# Patient Record
Sex: Female | Born: 1955 | Race: White | Hispanic: No | Marital: Married | Smoking: Never smoker
Health system: Southern US, Community
[De-identification: ages and names within clinical notes are randomized; demographics above are authoritative.]

## PROBLEM LIST (undated history)

## (undated) DIAGNOSIS — T7840XA Allergy, unspecified, initial encounter: Secondary | ICD-10-CM

## (undated) DIAGNOSIS — R7989 Other specified abnormal findings of blood chemistry: Secondary | ICD-10-CM

## (undated) DIAGNOSIS — M8580 Other specified disorders of bone density and structure, unspecified site: Secondary | ICD-10-CM

## (undated) DIAGNOSIS — C801 Malignant (primary) neoplasm, unspecified: Secondary | ICD-10-CM

## (undated) DIAGNOSIS — H269 Unspecified cataract: Secondary | ICD-10-CM

## (undated) DIAGNOSIS — E0590 Thyrotoxicosis, unspecified without thyrotoxic crisis or storm: Secondary | ICD-10-CM

## (undated) DIAGNOSIS — F429 Obsessive-compulsive disorder, unspecified: Secondary | ICD-10-CM

## (undated) DIAGNOSIS — Z9889 Other specified postprocedural states: Secondary | ICD-10-CM

## (undated) DIAGNOSIS — R112 Nausea with vomiting, unspecified: Secondary | ICD-10-CM

## (undated) DIAGNOSIS — Q969 Turner's syndrome, unspecified: Secondary | ICD-10-CM

## (undated) DIAGNOSIS — R945 Abnormal results of liver function studies: Secondary | ICD-10-CM

## (undated) HISTORY — DX: Malignant (primary) neoplasm, unspecified: C80.1

## (undated) HISTORY — DX: Thyrotoxicosis, unspecified without thyrotoxic crisis or storm: E05.90

## (undated) HISTORY — DX: Other specified postprocedural states: Z98.890

## (undated) HISTORY — DX: Other specified abnormal findings of blood chemistry: R79.89

## (undated) HISTORY — DX: Abnormal results of liver function studies: R94.5

## (undated) HISTORY — PX: AUGMENTATION MAMMAPLASTY: SUR837

## (undated) HISTORY — DX: Other specified disorders of bone density and structure, unspecified site: M85.80

## (undated) HISTORY — PX: POLYPECTOMY: SHX149

## (undated) HISTORY — DX: Allergy, unspecified, initial encounter: T78.40XA

## (undated) HISTORY — PX: DILATION AND CURETTAGE OF UTERUS: SHX78

## (undated) HISTORY — PX: COLONOSCOPY: SHX174

## (undated) HISTORY — DX: Turner's syndrome, unspecified: Q96.9

## (undated) HISTORY — DX: Obsessive-compulsive disorder, unspecified: F42.9

## (undated) HISTORY — DX: Unspecified cataract: H26.9

## (undated) HISTORY — DX: Nausea with vomiting, unspecified: R11.2

## (undated) HISTORY — PX: EYE SURGERY: SHX253

---

## 1986-05-12 HISTORY — PX: BREAST ENHANCEMENT SURGERY: SHX7

## 1999-03-01 ENCOUNTER — Encounter: Admission: RE | Admit: 1999-03-01 | Discharge: 1999-03-01 | Payer: Self-pay | Admitting: *Deleted

## 1999-03-01 ENCOUNTER — Encounter: Payer: Self-pay | Admitting: *Deleted

## 1999-10-28 ENCOUNTER — Encounter: Payer: Self-pay | Admitting: *Deleted

## 1999-10-28 ENCOUNTER — Encounter: Admission: RE | Admit: 1999-10-28 | Discharge: 1999-10-28 | Payer: Self-pay | Admitting: *Deleted

## 2001-03-04 ENCOUNTER — Encounter: Admission: RE | Admit: 2001-03-04 | Discharge: 2001-03-04 | Payer: Self-pay | Admitting: Internal Medicine

## 2001-03-04 ENCOUNTER — Encounter: Payer: Self-pay | Admitting: Internal Medicine

## 2002-05-24 ENCOUNTER — Encounter: Admission: RE | Admit: 2002-05-24 | Discharge: 2002-05-24 | Payer: Self-pay | Admitting: Internal Medicine

## 2002-05-24 ENCOUNTER — Encounter: Payer: Self-pay | Admitting: Internal Medicine

## 2003-07-14 ENCOUNTER — Encounter: Admission: RE | Admit: 2003-07-14 | Discharge: 2003-07-14 | Payer: Self-pay | Admitting: Family Medicine

## 2003-08-24 ENCOUNTER — Encounter: Admission: RE | Admit: 2003-08-24 | Discharge: 2003-08-24 | Payer: Self-pay | Admitting: Family Medicine

## 2004-08-30 ENCOUNTER — Ambulatory Visit: Payer: Self-pay | Admitting: Family Medicine

## 2004-08-30 ENCOUNTER — Encounter: Admission: RE | Admit: 2004-08-30 | Discharge: 2004-08-30 | Payer: Self-pay | Admitting: Family Medicine

## 2004-09-03 ENCOUNTER — Ambulatory Visit: Payer: Self-pay | Admitting: Family Medicine

## 2004-10-10 ENCOUNTER — Encounter: Payer: Self-pay | Admitting: Family Medicine

## 2004-12-12 ENCOUNTER — Ambulatory Visit: Payer: Self-pay | Admitting: Family Medicine

## 2005-04-10 ENCOUNTER — Ambulatory Visit: Payer: Self-pay | Admitting: Family Medicine

## 2005-04-25 ENCOUNTER — Ambulatory Visit: Payer: Self-pay | Admitting: Family Medicine

## 2005-04-28 ENCOUNTER — Ambulatory Visit: Payer: Self-pay | Admitting: Family Medicine

## 2005-05-07 ENCOUNTER — Ambulatory Visit: Payer: Self-pay | Admitting: Family Medicine

## 2005-07-03 ENCOUNTER — Ambulatory Visit: Payer: Self-pay | Admitting: Family Medicine

## 2005-10-14 ENCOUNTER — Encounter: Admission: RE | Admit: 2005-10-14 | Discharge: 2005-10-14 | Payer: Self-pay | Admitting: Family Medicine

## 2005-11-26 ENCOUNTER — Ambulatory Visit: Payer: Self-pay | Admitting: Family Medicine

## 2005-12-02 ENCOUNTER — Ambulatory Visit: Payer: Self-pay | Admitting: Gastroenterology

## 2005-12-23 ENCOUNTER — Ambulatory Visit: Payer: Self-pay | Admitting: Internal Medicine

## 2006-04-20 ENCOUNTER — Ambulatory Visit: Payer: Self-pay | Admitting: Internal Medicine

## 2006-04-20 LAB — CONVERTED CEMR LAB
ALT: 38 units/L (ref 0–40)
Albumin: 3.7 g/dL (ref 3.5–5.2)
Bilirubin, Direct: 0.1 mg/dL (ref 0.0–0.3)
Total Bilirubin: 0.8 mg/dL (ref 0.3–1.2)
Total Protein: 6.3 g/dL (ref 6.0–8.3)

## 2006-08-06 ENCOUNTER — Ambulatory Visit: Payer: Self-pay | Admitting: Internal Medicine

## 2006-08-06 LAB — CONVERTED CEMR LAB
ALT: 33 units/L (ref 0–40)
AST: 23 units/L (ref 0–37)
Albumin: 3.7 g/dL (ref 3.5–5.2)
Alkaline Phosphatase: 130 units/L — ABNORMAL HIGH (ref 39–117)
Total Protein: 6.3 g/dL (ref 6.0–8.3)

## 2006-10-16 ENCOUNTER — Encounter: Admission: RE | Admit: 2006-10-16 | Discharge: 2006-10-16 | Payer: Self-pay | Admitting: Family Medicine

## 2006-10-23 ENCOUNTER — Encounter (INDEPENDENT_AMBULATORY_CARE_PROVIDER_SITE_OTHER): Payer: Self-pay | Admitting: *Deleted

## 2006-11-20 ENCOUNTER — Encounter: Payer: Self-pay | Admitting: Family Medicine

## 2006-11-20 DIAGNOSIS — F429 Obsessive-compulsive disorder, unspecified: Secondary | ICD-10-CM

## 2006-11-20 DIAGNOSIS — Q969 Turner's syndrome, unspecified: Secondary | ICD-10-CM | POA: Insufficient documentation

## 2006-11-20 DIAGNOSIS — K648 Other hemorrhoids: Secondary | ICD-10-CM | POA: Insufficient documentation

## 2006-11-20 DIAGNOSIS — H9190 Unspecified hearing loss, unspecified ear: Secondary | ICD-10-CM

## 2006-11-30 ENCOUNTER — Ambulatory Visit: Payer: Self-pay | Admitting: Family Medicine

## 2006-11-30 DIAGNOSIS — E039 Hypothyroidism, unspecified: Secondary | ICD-10-CM | POA: Insufficient documentation

## 2006-12-01 ENCOUNTER — Encounter: Payer: Self-pay | Admitting: Family Medicine

## 2006-12-01 LAB — CONVERTED CEMR LAB
ALT: 49 units/L — ABNORMAL HIGH (ref 0–35)
AST: 34 units/L (ref 0–37)
Albumin: 3.9 g/dL (ref 3.5–5.2)
Alkaline Phosphatase: 142 units/L — ABNORMAL HIGH (ref 39–117)
BUN: 10 mg/dL (ref 6–23)
Basophils Absolute: 0 10*3/uL (ref 0.0–0.1)
Bilirubin, Direct: 0.1 mg/dL (ref 0.0–0.3)
Calcium: 9 mg/dL (ref 8.4–10.5)
Chloride: 104 meq/L (ref 96–112)
Eosinophils Absolute: 0.1 10*3/uL (ref 0.0–0.6)
GFR calc Af Amer: 115 mL/min
GFR calc non Af Amer: 95 mL/min
Glucose, Bld: 101 mg/dL — ABNORMAL HIGH (ref 70–99)
HCT: 37.7 % (ref 36.0–46.0)
HDL: 62.7 mg/dL (ref >39.0)
LDL Cholesterol: 98 mg/dL (ref 0–99)
Neutrophils Relative %: 63.9 % (ref 43.0–77.0)
Platelets: 277 10*3/uL (ref 150–400)
Potassium: 4.1 meq/L (ref 3.5–5.1)
RBC: 4 M/uL (ref 3.87–5.11)
Sodium: 142 meq/L (ref 135–145)

## 2006-12-29 ENCOUNTER — Encounter: Payer: Self-pay | Admitting: Family Medicine

## 2006-12-29 DIAGNOSIS — M8580 Other specified disorders of bone density and structure, unspecified site: Secondary | ICD-10-CM

## 2007-01-27 ENCOUNTER — Ambulatory Visit: Payer: Self-pay | Admitting: Family Medicine

## 2007-01-27 DIAGNOSIS — R740 Nonspecific elevation of levels of transaminase and lactic acid dehydrogenase [LDH]: Secondary | ICD-10-CM

## 2007-01-29 LAB — CONVERTED CEMR LAB
ALT: 34 units/L (ref 0–35)
Albumin: 3.9 g/dL (ref 3.5–5.2)
Bilirubin, Direct: 0.1 mg/dL (ref 0.0–0.3)
IgA: 259 mg/dL (ref 68–378)
Total Protein: 6.4 g/dL (ref 6.0–8.3)

## 2007-10-21 ENCOUNTER — Encounter: Admission: RE | Admit: 2007-10-21 | Discharge: 2007-10-21 | Payer: Self-pay | Admitting: Family Medicine

## 2007-10-26 ENCOUNTER — Encounter (INDEPENDENT_AMBULATORY_CARE_PROVIDER_SITE_OTHER): Payer: Self-pay | Admitting: *Deleted

## 2007-12-07 ENCOUNTER — Telehealth (INDEPENDENT_AMBULATORY_CARE_PROVIDER_SITE_OTHER): Payer: Self-pay | Admitting: *Deleted

## 2007-12-30 ENCOUNTER — Ambulatory Visit: Payer: Self-pay | Admitting: Family Medicine

## 2007-12-30 DIAGNOSIS — B353 Tinea pedis: Secondary | ICD-10-CM

## 2008-01-05 LAB — CONVERTED CEMR LAB
AST: 25 units/L (ref 0–37)
Albumin: 4 g/dL (ref 3.5–5.2)
Alkaline Phosphatase: 115 units/L (ref 39–117)
Basophils Absolute: 0 10*3/uL (ref 0.0–0.1)
Basophils Relative: 0.2 % (ref 0.0–3.0)
Calcium: 9.2 mg/dL (ref 8.4–10.5)
Eosinophils Absolute: 0.1 10*3/uL (ref 0.0–0.7)
Eosinophils Relative: 0.8 % (ref 0.0–5.0)
HCT: 38.6 % (ref 36.0–46.0)
MCHC: 34.7 g/dL (ref 30.0–36.0)
Monocytes Absolute: 0.8 10*3/uL (ref 0.1–1.0)
Monocytes Relative: 8.2 % (ref 3.0–12.0)
Neutro Abs: 6.3 10*3/uL (ref 1.4–7.7)
Neutrophils Relative %: 66.9 % (ref 43.0–77.0)
Potassium: 4 meq/L (ref 3.5–5.1)
TSH: 4.09 microintl units/mL (ref 0.35–5.50)
Total Protein: 6.2 g/dL (ref 6.0–8.3)

## 2008-01-06 ENCOUNTER — Ambulatory Visit: Payer: Self-pay | Admitting: Family Medicine

## 2008-01-06 LAB — CONVERTED CEMR LAB: OCCULT 1: NEGATIVE

## 2008-03-07 ENCOUNTER — Telehealth: Payer: Self-pay | Admitting: Family Medicine

## 2008-10-26 ENCOUNTER — Encounter: Admission: RE | Admit: 2008-10-26 | Discharge: 2008-10-26 | Payer: Self-pay | Admitting: Family Medicine

## 2008-10-27 ENCOUNTER — Encounter (INDEPENDENT_AMBULATORY_CARE_PROVIDER_SITE_OTHER): Payer: Self-pay | Admitting: *Deleted

## 2009-01-02 ENCOUNTER — Ambulatory Visit: Payer: Self-pay | Admitting: Family Medicine

## 2009-01-04 ENCOUNTER — Encounter (INDEPENDENT_AMBULATORY_CARE_PROVIDER_SITE_OTHER): Payer: Self-pay | Admitting: *Deleted

## 2009-01-04 LAB — CONVERTED CEMR LAB
ALT: 42 units/L — ABNORMAL HIGH (ref 0–35)
AST: 26 units/L (ref 0–37)
Albumin: 4.2 g/dL (ref 3.5–5.2)
Alkaline Phosphatase: 124 units/L — ABNORMAL HIGH (ref 39–117)
Bilirubin, Direct: 0 mg/dL (ref 0.0–0.3)
Cholesterol: 164 mg/dL (ref 0–200)
Eosinophils Absolute: 0.1 10*3/uL (ref 0.0–0.7)
Eosinophils Relative: 1 % (ref 0.0–5.0)
HCT: 40.2 % (ref 36.0–46.0)
HDL: 58.4 mg/dL (ref >39.00)
Lymphocytes Relative: 21 % (ref 12.0–46.0)
MCV: 95.9 fL (ref 78.0–100.0)
Monocytes Absolute: 0.7 10*3/uL (ref 0.1–1.0)
Neutro Abs: 5.7 10*3/uL (ref 1.4–7.7)
Neutrophils Relative %: 69 % (ref 43.0–77.0)
Potassium: 3.9 meq/L (ref 3.5–5.1)
RDW: 11.8 % (ref 11.5–14.6)
TSH: 3.76 microintl units/mL (ref 0.35–5.50)
Total CHOL/HDL Ratio: 3
Triglycerides: 94 mg/dL (ref 0.0–149.0)
VLDL: 18.8 mg/dL (ref 0.0–40.0)

## 2009-01-23 ENCOUNTER — Telehealth: Payer: Self-pay | Admitting: Family Medicine

## 2009-01-29 ENCOUNTER — Encounter: Payer: Self-pay | Admitting: Family Medicine

## 2009-02-13 ENCOUNTER — Ambulatory Visit: Payer: Self-pay | Admitting: Family Medicine

## 2009-05-14 ENCOUNTER — Ambulatory Visit: Payer: Self-pay | Admitting: Family Medicine

## 2009-05-14 DIAGNOSIS — H6592 Unspecified nonsuppurative otitis media, left ear: Secondary | ICD-10-CM

## 2009-05-22 ENCOUNTER — Encounter: Payer: Self-pay | Admitting: Family Medicine

## 2009-05-25 ENCOUNTER — Ambulatory Visit: Payer: Self-pay | Admitting: Family Medicine

## 2009-05-28 ENCOUNTER — Telehealth (INDEPENDENT_AMBULATORY_CARE_PROVIDER_SITE_OTHER): Payer: Self-pay | Admitting: *Deleted

## 2009-12-28 ENCOUNTER — Encounter: Admission: RE | Admit: 2009-12-28 | Discharge: 2009-12-28 | Payer: Self-pay | Admitting: Family Medicine

## 2010-01-01 ENCOUNTER — Encounter (INDEPENDENT_AMBULATORY_CARE_PROVIDER_SITE_OTHER): Payer: Self-pay | Admitting: *Deleted

## 2010-01-04 ENCOUNTER — Encounter: Payer: Self-pay | Admitting: Family Medicine

## 2010-02-22 ENCOUNTER — Telehealth (INDEPENDENT_AMBULATORY_CARE_PROVIDER_SITE_OTHER): Payer: Self-pay | Admitting: *Deleted

## 2010-02-25 ENCOUNTER — Ambulatory Visit: Payer: Self-pay | Admitting: Family Medicine

## 2010-02-25 LAB — CONVERTED CEMR LAB
Basophils Absolute: 0 10*3/uL (ref 0.0–0.1)
Basophils Relative: 0.5 % (ref 0.0–3.0)
Bilirubin, Direct: 0.2 mg/dL (ref 0.0–0.3)
Calcium: 8.9 mg/dL (ref 8.4–10.5)
Eosinophils Relative: 1.4 % (ref 0.0–5.0)
GFR calc non Af Amer: 91.8 mL/min (ref >60)
Glucose, Bld: 90 mg/dL (ref 70–99)
HCT: 37.3 % (ref 36.0–46.0)
Lymphocytes Relative: 26.9 % (ref 12.0–46.0)
MCHC: 34.2 g/dL (ref 30.0–36.0)
Neutrophils Relative %: 60.8 % (ref 43.0–77.0)
RDW: 12.7 % (ref 11.5–14.6)
Sodium: 139 meq/L (ref 135–145)
Total Bilirubin: 1.1 mg/dL (ref 0.3–1.2)
WBC: 7.2 10*3/uL (ref 4.5–10.5)

## 2010-02-27 ENCOUNTER — Ambulatory Visit: Payer: Self-pay | Admitting: Family Medicine

## 2010-03-19 ENCOUNTER — Ambulatory Visit: Payer: Self-pay | Admitting: Family Medicine

## 2010-03-21 ENCOUNTER — Encounter (INDEPENDENT_AMBULATORY_CARE_PROVIDER_SITE_OTHER): Payer: Self-pay | Admitting: *Deleted

## 2010-05-12 HISTORY — PX: OTHER SURGICAL HISTORY: SHX169

## 2010-06-11 NOTE — Assessment & Plan Note (Signed)
Summary: CPX/CLE   Vital Signs:  Patient profile:   55 year old female Height:      59.75 inches Weight:      151.50 pounds BMI:     29.94 Temp:     98.3 degrees F oral Pulse rate:   80 / minute Pulse rhythm:   regular BP sitting:   118 / 76  (left arm) Cuff size:   regular  Vitals Entered By: Lewanda Rife LPN (February 27, 2010 2:29 PM) CC: CPX LMP Years ago   History of Present Illness: here for health mt exam and to rev chronic med problems  is keeping busy and feeling good overall   foot fungus is much better - just a little left   wt is up 4 lb has been eating reasonably healthy  something always comes up to cancel exercise    bp is 118/76- good   tsh stable  other labs are good   osteopenia - on premphase -- gyn still has her on it for symptoms, ? when she decides to stop it  fosamax - close to 5 years  ca and vit d - still taking that  dexa 910  pap 8/09 last visit did not do exam -- was this august  did not do breast exam either  mam 8/11 -- was normal  self exam --no lumps   td was 04   at [facility] - now gets regular mri (cardiac) instead of echo for her turners-  know why   Td 04   will get a flu shot today  declines colonoscopy- will do stool card for screening   Allergies (verified): No Known Drug Allergies  Past History:  Past Medical History: Last updated: 01/02/2009 Hyperthyroidism hearing loss - with hearing aides  OCD turner's syndrome  osteopenia elevated LFTs   ENT- Dr Lovey Newcomer  gyn- at [facility]   Past Surgical History: Last updated: 11/20/2006 Breast implants Dexa- osteopenia Abd [place]- neg (11/2005) Dexa- increased BMD (2006) Cholecystectomy (03/2006)  Family History: Last updated: 11/30/2006 Father: DJD, DM, prostate ca, colon polyps removed PGM DM Mother:DM GM colon ca  Siblings: 1 brother, 1 sister  Social History: Last updated: 12/30/2007 Marital Status: Married Children: none Occupation: home non smoker    Risk Factors: Smoking Status: never (11/20/2006)  Review of Systems General:  Denies fatigue, loss of appetite, and malaise. Eyes:  Denies blurring and eye irritation. CV:  Denies chest pain or discomfort and lightheadness. Resp:  Denies cough, shortness of breath, and wheezing. GI:  Denies abdominal pain and change in bowel habits. MS:  Denies joint pain, joint redness, joint swelling, and stiffness. Derm:  Denies itching, lesion(s), poor wound healing, and rash. Neuro:  Denies headaches, numbness, and tingling. Psych:  Denies anxiety and depression. Endo:  Denies cold intolerance, excessive thirst, excessive urination, and heat intolerance. Heme:  Denies abnormal bruising and bleeding.  Physical Exam  General:  Well-developed,well-nourished,in no acute distress; alert,appropriate and cooperative throughout examination Head:  normocephalic, atraumatic, and no abnormalities observed.   Eyes:  vision grossly intact, pupils equal, pupils round, and pupils reactive to light.   Ears:  R ear normal and L ear normal.   Nose:  no nasal discharge.   Mouth:  pharynx pink and moist.   Neck:  supple with full rom and no masses or thyromegally, no JVD or carotid bruit  Chest Wall:  No deformities, masses, or tenderness noted. Breasts:  No mass, nodules, thickening, tenderness, bulging, retraction, inflamation, nipple  discharge or skin changes noted.  (implants noted) Lungs:  Normal respiratory effort, chest expands symmetrically. Lungs are clear to auscultation, no crackles or wheezes. Heart:  Normal rate and regular rhythm. S1 and S2 normal without gallop, murmur, click, rub or other extra sounds. Abdomen:  Bowel sounds positive,abdomen soft and non-tender without masses, organomegaly or hernias noted. no renal bruits Msk:  No deformity or scoliosis noted of thoracic or lumbar spine.  no acute joint changes  Pulses:  R and L carotid,radial,femoral,dorsalis pedis and posterior tibial pulses  are full and equal bilaterally Extremities:  No clubbing, cyanosis, edema, or deformity noted with normal full range of motion of all joints.   Neurologic:  sensation intact to light touch, gait normal, and DTRs symmetrical and normal.   Skin:  Intact without suspicious lesions or rashes Cervical Nodes:  No lymphadenopathy noted Axillary Nodes:  No palpable lymphadenopathy Inguinal Nodes:  No significant adenopathy Psych:  normal affect, talkative and pleasant    Impression & Recommendations:  Problem # 1:  HEALTH MAINTENANCE EXAM (ICD-V70.0) Assessment Comment Only reviewed health habits including diet, exercise and skin cancer prevention reviewed health maintenance list and family history reviewed labs in detail flu shot today  Problem # 2:  SCREENING, COLON CANCER (ICD-V76.51) Assessment: Comment Only  pt declines colonoscopy at this time has decided to do stool immunoassay instead- this was given to her  Orders: Prescription Created Electronically [phone])  Problem # 3:  ELEVATION, TRANSAMINASE/LDH LEVELS (ICD-790.4) Assessment: Unchanged  this is stable without change disc watching fat in diet  will continue to monitor  Orders: Prescription Created Electronically [phone])  Problem # 4:  OSTEOPENIA (ICD-733.90) Assessment: Unchanged  stable last year  since she has been on bisphosphenates for 5 years will stop adv to continue ca and D  dexa next fall  Her updated medication list for this problem includes:    Fosamax 70 Mg Tabs (Alendronate sodium) .Marland Kitchen... 1 by mouth once weekly  Orders: Prescription Created Electronically [phone])  Problem # 5:  HYPOTHYROIDISM NOS (ICD-244.9) Assessment: Unchanged  tsh is stable and no clinical changes  will continue current dose  Her updated medication list for this problem includes:    Synthroid 50 Mcg Tabs (Levothyroxine sodium) .Marland Kitchen... 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 Take one by mouth daily as directed 2)  Prozac 40 Mg Caps (Fluoxetine hcl) .... Take one by mouth daily 3)  Synthroid 50 Mcg Tabs (Levothyroxine sodium) .... Take one by mouth daily 4)  Fosamax 70 Mg Tabs (Alendronate sodium) .Marland Kitchen.. 1 by mouth once weekly 5)  Women's One A Day Vitamin  .Marland Kitchen.. 1 by mouth qd 6)  Calcium Citrate With Vitamin D ?mg  .... Two tablets by mouth daily 7)  Loratadine 10 Mg Tabs (Loratadine) .... Take 1 tablet by mouth once a day  Other Orders: Admin 1st Vaccine (07371) Flu Vaccine 66yrs + (06269)  Patient Instructions: 1)  stop the fosamax since you have been on it for 5 years  2)  we will plan  dexa next fall  3)  please do stool card for colon screen  4)  flu shot today  5)  work on healthy diet and exercise for weight loss  Prescriptions: SYNTHROID 50 MCG  TABS (LEVOTHYROXINE SODIUM) take one by mouth daily  #90 x 3   Entered and Authorized by:   Judith Part MD   Signed by:   Judith Part MD on 02/27/2010   Method used:   Electronically to        [facility] [address] (retail)       [address], [address]       [place], [place]  [address]       Ph: [phone]       Fax: [phone]   RxID:   [id number] PROZAC 40 MG  CAPS (FLUOXETINE HCL) take one by mouth daily  #90 x 3   Entered and Authorized by:   Judith Part MD   Signed by:   Judith Part MD on 02/27/2010   Method used:   Electronically to        [facility] [address] (retail)       [address]       [place], [place]  [address]       Ph: [phone]       Fax: [phone]   RxID:   [id number]    Orders Added: 1)  Admin 1st Vaccine [90471] 2)  Flu Vaccine 28yrs + [88416] 3)  Prescription Created Electronically [G8553] 4)  Est. Patient 40-64 years [60630]    Current Allergies (reviewed today): No known allergies   Preventive Care Screening     gyn visit was aug of 2011- but did not have exam breast or pelvic    Flu Vaccine Consent Questions     Do you have a history of severe allergic reactions to this vaccine? no    Any prior history of allergic reactions to egg and/or gelatin? no    Do you have a sensitivity to the preservative Thimersol? no    Do you have a past history of Guillan-Barre Syndrome? no    Do you currently have an acute febrile illness? no    Have you ever had a severe reaction to latex? no    Vaccine information given and explained to patient? yes    Are you currently pregnant? no    Lot Number:[id number]   Exp Date:11/09/2010   Site Given  Left Deltoid IM Lewanda Rife LPN  February 27, 2010 4:39 PM  .[email or web address]

## 2010-06-11 NOTE — Letter (Signed)
Summary: [facility]: Immunoassay Fecal Occult Blood (iFOB) Order Form  [place] [facility]  [address] [place], [place] [address]   Phone: [phone]  Fax: [phone]      [facility]: Immunoassay Fecal Occult Blood (iFOB) Order Form   February 27, 2010 MRN: [id number]   Debra Ford [birth year]   Physicican Name:_____Tower____________________  Diagnosis Code:_______V76.51___________________      Judith Part MD

## 2010-06-11 NOTE — Progress Notes (Signed)
----   Converted from flag ---- ---- 02/20/2010 8:35 PM, Judith Part MD wrote: please check wellness/ lipid v70.0  ---- 02/20/2010 12:00 PM, Liane Comber CMA (AAMA) wrote: Lab orders please! Good Morning! This pt is scheduled for cpx labs Monday, which labs to draw and dx codes to use? Thanks Tasha ------------------------------

## 2010-06-11 NOTE — Letter (Signed)
Summary: [facility]  [facility]   Imported By: Lanelle Bal 01/11/2010 13:46:34  _____________________________________________________________________  External Attachment:    Type:   Image     Comment:   External Document

## 2010-06-11 NOTE — Letter (Signed)
Summary: [facility] Clearance/[facility]  [facility] Clearance/[facility]   Imported By: Lanelle Bal 05/30/2009 10:13:03  _____________________________________________________________________  External Attachment:    Type:   Image     Comment:   External Document

## 2010-06-11 NOTE — Progress Notes (Signed)
Summary: notes for surgical clearance faxed.  Phone Note Outgoing Call   Summary of Call: Notes for surgical clearance faxed to [phone]. Initial call taken by: Lowella Petties CMA,  May 28, 2009 11:00 AM

## 2010-06-11 NOTE — Assessment & Plan Note (Signed)
Summary: PRE OP APPT/DLO   Vital Signs:  Patient profile:   55 year old female Weight:      147 pounds Temp:     98.1 degrees F oral Pulse rate:   72 / minute Pulse rhythm:   regular BP sitting:   122 / 72  (left arm) Cuff size:   regular  Vitals Entered By: Lowella Petties CMA (May 25, 2009 11:55 AM)  History of Present Illness: pt is here for surgical clearance today  is getting ear implant -- for hearing aid -- in feb (in [place] feb 10th  will need labs 10 d before with PT/ PTT and cbc and chem 7  no hx of heart or lung problems   thinks they will do general anesthesia  years ago had some nausea afterward -- with vomiting  no allergies  no bleeding problems or blood clots  no strokes or seizures  hypothyroidism has been in good control   needs EKG for surgery   was told not to take vitamins   sent ahead papers with what she needs     Allergies: No Known Drug Allergies  Past History:  Past Medical History: Last updated: 01/02/2009 Hyperthyroidism hearing loss - with hearing aides  OCD turner's syndrome  osteopenia elevated LFTs   ENT- Dr Lovey Newcomer  gyn- at [facility]   Past Surgical History: Last updated: 11/20/2006 Breast implants Dexa- osteopenia Abd [place]- neg (11/2005) Dexa- increased BMD (2006) Cholecystectomy (03/2006)  Family History: Last updated: 11/30/2006 Father: DJD, DM, prostate ca, colon polyps removed PGM DM Mother:DM GM colon ca  Siblings: 1 brother, 1 sister  Social History: Last updated: 12/30/2007 Marital Status: Married Children: none Occupation: home non smoker   Risk Factors: Smoking Status: never (11/20/2006)  Review of Systems General:  Denies fatigue, fever, loss of appetite, and malaise. Eyes:  Denies blurring and eye pain. CV:  Denies chest pain or discomfort, lightheadness, and palpitations. Resp:  Denies cough and wheezing. GI:  Denies change in bowel habits, nausea, and vomiting. GU:  Denies urinary  frequency and urinary hesitancy. MS:  Denies joint pain. Derm:  Denies poor wound healing. Endo:  Denies excessive thirst and excessive urination. Heme:  Denies abnormal bruising and bleeding. Allergy:  Denies persistent infections.  Physical Exam  General:  Well-developed,well-nourished,in no acute distress; alert,appropriate and cooperative throughout examination Head:  normocephalic, atraumatic, and no abnormalities observed.   Eyes:  vision grossly intact, pupils equal, pupils round, and pupils reactive to light.  no conjunctival pallor, injection or icterus  Ears:  hearing impaired Mouth:  pharynx pink and moist.   Neck:  supple with full rom and no masses or thyromegally, no JVD or carotid bruit  Lungs:  Normal respiratory effort, chest expands symmetrically. Lungs are clear to auscultation, no crackles or wheezes. Heart:  Normal rate and regular rhythm. S1 and S2 normal without gallop, murmur, click, rub or other extra sounds. Abdomen:  Bowel sounds positive,abdomen soft and non-tender without masses, organomegaly or hernias noted. no renal bruits Msk:  No deformity or scoliosis noted of thoracic or lumbar spine.   Pulses:  R and L carotid,radial,femoral,dorsalis pedis and posterior tibial pulses are full and equal bilaterally Extremities:  No clubbing, cyanosis, edema, or deformity noted with normal full range of motion of all joints.   Neurologic:  sensation intact to light touch, gait normal, and DTRs symmetrical and normal.   Skin:  Intact without suspicious lesions or rashes Cervical Nodes:  No lymphadenopathy noted Inguinal Nodes:  No significant adenopathy Psych:  normal affect, talkative and pleasant    Impression & Recommendations:  Problem # 1:  PREOPERATIVE EXAMINATION (ICD-V72.84) Assessment New  no restrictions for surgery  pt will let surgeon know about nausea in past with gen anesth  no heart/lung or bleeding problems  no cardiac or pulm problems  will  schedule requested labs 10 days prior to surgery  Orders: EKG w/ Interpretation (93000)  Complete Medication List: 1)  Premphase 0.625-5 Mg Tabs (Conj estrog-medroxyprogest ace) .... Take one by mouth daily as directed 2)  Prozac 40 Mg Caps (Fluoxetine hcl) .... Take one by mouth daily 3)  Synthroid 50 Mcg Tabs (Levothyroxine sodium) .... Take one by mouth daily 4)  Fosamax 70 Mg Tabs (Alendronate sodium) .Marland Kitchen.. 1 by mouth once weekly 5)  Women's One A Day Vitamin  .Marland Kitchen.. 1 by mouth qd 6)  Caltrate Plus D  .... 2 by mouth qd 7)  Lamisil At Powd (Talc-starch)  Patient Instructions: 1)  schedule non fasting labs on feb 1 PT/ PTT/ cbc with diff/ bmp and hepatic function   790.4/ and v72.84 2)  no restrictions for surgery  3)  tell the anethesia doctor about your nausea in the past  4)  for foot fungus -- look for lamasil powder over the counter - and use as directed   Prior Medications (reviewed today): PREMPHASE 0.625-5 MG  TABS (CONJ ESTROG-MEDROXYPROGEST ACE) take one by mouth daily as directed PROZAC 40 MG  CAPS (FLUOXETINE HCL) take one by mouth daily SYNTHROID 50 MCG  TABS (LEVOTHYROXINE SODIUM) take one by mouth daily FOSAMAX 70 MG  TABS (ALENDRONATE SODIUM) 1 by mouth once weekly WOMEN'S ONE A DAY VITAMIN () 1 by mouth qd CALTRATE PLUS D () 2 by mouth qd LAMISIL AT  [facility])  Current Allergies: No known allergies    EKG  Procedure date:  05/25/2009  Findings:      NSR with rate of 70 no acute changes

## 2010-06-11 NOTE — Letter (Signed)
Summary: Results Follow up Letter  [place] [facility]  [address] [place], [place] [address]   Phone: [phone]  Fax: [phone]    03/21/2010 MRN: [id number]    Debra Ford [address] [place], [place]  [address]    Dear Ms. Babington,  The following are the results of your recent test(s):  Test         Result    Pap Smear:        Normal _____  Not Normal _____ Comments: ______________________________________________________ Cholesterol: LDL(Bad cholesterol):         Your goal is less than:         HDL (Good cholesterol):       Your goal is more than: Comments:  ______________________________________________________ Mammogram:        Normal _____  Not Normal _____ Comments:  ___________________________________________________________________ Hemoccult:        Normal __X___  Not normal _______ Comments:    _____________________________________________________________________ Other Tests:    We routinely do not discuss normal results over the telephone.  If you desire a copy of the results, or you have any questions about this information we can discuss them at your next office visit.   Sincerely,    Marne A. Milinda Antis, M.D.  MAT:lsf

## 2010-06-11 NOTE — Assessment & Plan Note (Signed)
Summary: EAR INFECTION   Vital Signs:  Patient profile:   55 year old female Height:      60.25 inches Weight:      148.8 pounds BMI:     28.92 Temp:     98.1 degrees F oral Pulse rate:   80 / minute Pulse rhythm:   regular BP sitting:   120 / 70  (left arm) Cuff size:   regular  Vitals Entered By: Benny Lennert CMA Duncan Dull) (May 14, 2009 3:34 PM)  History of Present Illness: Chief complaint ear infection  Acute Visit History:      The patient complains of earache and nasal discharge.  These symptoms began 2 weeks ago.  She denies cough, fever, headache, and sinus problems.  Other comments include: wears hearing aids B ears...has upcoming  cochlear implant.        The earache is located on the left side.        Problems Prior to Update: 1)  Screening, Colon Cancer  (ICD-V76.51) 2)  Special Screening Malig Neoplasms Other Sites  (ICD-V76.49) 3)  Tinea Pedis  (ICD-110.4) 4)  Elevation, Transaminase/ldh Levels  (ICD-790.4) 5)  Osteopenia  (ICD-733.90) 6)  Hypothyroidism Nos  (ICD-244.9) 7)  Health Maintenance Exam  (ICD-V70.0) 8)  Hx of Decreased Hearing  (ICD-389.9) 9)  Hx of Hemorrhoids, Internal  (ICD-455.0) 10)  Hx of Obsessive-compulsive Disorder  (ICD-300.3) 11)  Hx of Turner's Syndrome  (ICD-758.6)  Current Medications (verified): 1)  Premphase 0.625-5 Mg  Tabs (Conj Estrog-Medroxyprogest Ace) .... Take One By Mouth Daily As Directed 2)  Prozac 40 Mg  Caps (Fluoxetine Hcl) .... Take One By Mouth Daily 3)  Synthroid 50 Mcg  Tabs (Levothyroxine Sodium) .... Take One By Mouth Daily 4)  Fosamax 70 Mg  Tabs (Alendronate Sodium) .Marland Kitchen.. 1 By Mouth Once Weekly 5)  Women's One A Day Vitamin .Marland Kitchen.. 1 By Mouth Qd 6)  Caltrate Plus D .... 2 By Mouth Qd 7)  Amoxicillin 500 Mg Tabs (Amoxicillin) .... 2 Tab By Mouth Two Times A Day X 10 Day  Allergies (verified): No Known Drug Allergies  Past History:  Past medical, surgical, family and social histories (including risk  factors) reviewed, and no changes noted (except as noted below).  Past Medical History: Reviewed history from 01/02/2009 and no changes required. Hyperthyroidism hearing loss - with hearing aides  OCD turner's syndrome  osteopenia elevated LFTs   ENT- Dr Lovey Newcomer  gyn- [facility]   Past Surgical History: Reviewed history from 11/20/2006 and no changes required. Breast implants Dexa- osteopenia Abd [place]- neg (11/2005) Dexa- increased BMD (2006) Cholecystectomy (03/2006)  Family History: Reviewed history from 11/30/2006 and no changes required. Father: DJD, DM, prostate ca, colon polyps removed PGM DM Mother:DM GM colon ca  Siblings: 1 brother, 1 sister  Social History: Reviewed history from 12/30/2007 and no changes required. Marital Status: Married Children: none Occupation: home non smoker   Review of Systems General:  Denies fatigue. CV:  Denies chest pain or discomfort. Resp:  Denies shortness of breath, sputum productive, and wheezing.  Physical Exam  General:  Well-developed,well-nourished,in no acute distress; alert,appropriate and cooperative throughout examination Ears:  B hearing aides, left TM with purulent fluid, no light reflex, bulging but no erythema no pinna pain.  roight TM clear Nose:  clear nasal discharge, no mucosal pallor.   Mouth:  Oral mucosa and oropharynx without lesions or exudates.  Teeth in good repair. Neck:  no carotid bruit or thyromegaly no  cervical or supraclavicular lymphadenopathy  Lungs:  Normal respiratory effort, chest expands symmetrically. Lungs are clear to auscultation, no crackles or wheezes. Heart:  Normal rate and regular rhythm. S1 and S2 normal without gallop, murmur, click, rub or other extra sounds.   Impression & Recommendations:  Problem # 1:  LOM (ICD-382.9)  Treat with nasal saline irrigation and oral snitbioitcs to cover oropharyngeal pathogens.  Her updated medication list for this problem includes:     Amoxicillin 500 Mg Tabs (Amoxicillin) .Marland Kitchen... 2 tab by mouth two times a day x 10 day  Call if no improvement in 48-72 hours or sooner if worsening symptoms.   Complete Medication List: 1)  Premphase 0.625-5 Mg Tabs (Conj estrog-medroxyprogest ace) .... Take one by mouth daily as directed 2)  Prozac 40 Mg Caps (Fluoxetine hcl) .... Take one by mouth daily 3)  Synthroid 50 Mcg Tabs (Levothyroxine sodium) .... Take one by mouth daily 4)  Fosamax 70 Mg Tabs (Alendronate sodium) .Marland Kitchen.. 1 by mouth once weekly 5)  Women's One A Day Vitamin  .Marland Kitchen.. 1 by mouth qd 6)  Caltrate Plus D  .... 2 by mouth qd 7)  Amoxicillin 500 Mg Tabs (Amoxicillin) .... 2 tab by mouth two times a day x 10 day Prescriptions: AMOXICILLIN 500 MG TABS (AMOXICILLIN) 2 tab by mouth two times a day x 10 day  #40 x 0   Entered and Authorized by:   Kerby Nora MD   Signed by:   Kerby Nora MD on 05/14/2009   Method used:   Electronically to        [facility] [address] ([facility]ail)       [address], [address]       [place], [place]  [address]       Ph: [phone]       Fax: [phone]   RxID:   [id number]   Current Allergies (reviewed today): No known allergies

## 2010-06-11 NOTE — Letter (Signed)
Summary: Results Follow up Letter  [place] [facility]  [address] [place], [place] [address]   Phone: [phone]  Fax: [phone]    01/01/2010 MRN: [id number]  ROSEBUD KOENEN [address] [place], [place]  [address]  Dear Ms. Hackbart,  The following are the results of your recent test(s):  Test         Result    Pap Smear:        Normal _____  Not Normal _____ Comments: ______________________________________________________ Cholesterol: LDL(Bad cholesterol):         Your goal is less than:         HDL (Good cholesterol):       Your goal is more than: Comments:  ______________________________________________________ Mammogram:        Normal __X___  Not Normal _____ Comments:Repeat in 1 year  ___________________________________________________________________ Hemoccult:        Normal _____  Not normal _______ Comments:    _____________________________________________________________________ Other Tests:    We routinely do not discuss normal results over the telephone.  If you desire a copy of the results, or you have any questions about this information we can discuss them at your next office visit.   Sincerely,       Janee Morn, CMA(AAMA)for Dr. Roxy Manns

## 2010-07-26 ENCOUNTER — Telehealth: Payer: Self-pay | Admitting: Family Medicine

## 2010-07-30 NOTE — Progress Notes (Signed)
Summary: Needs med for nerves  Phone Note Call from Patient Call back at [phone] or home [phone]   Caller: Spouse Call For: Dr. Ermalene Searing Summary of Call: Dr Milinda Antis is out of office. Pt's mother has been diagnosed with terminal cancer and pt needs med for nerves. Pt uses [facility] [address].Please advise.  Initial call taken by: Lewanda Rife LPN,  July 26, 2010 12:10 PM  Follow-up for Phone Call        please call in xanax 0.25mg  by mouth three times a day as needed for anxiety.  #20, no rf.  sedation caution. have her call Dr. Milinda Antis Tuesday with update, another MD sooner if needed.  Thanks.   Additional Follow-up for Phone Call Additional follow up Details #1::        Medication phoned to [facility] [address] pharmacy as instructed. Patient's husband notified as instructed by telephone. Lewanda Rife LPN  July 26, 2010 4:22 PM     New/Updated Medications: ALPRAZOLAM 0.25 MG TABS (ALPRAZOLAM) 1 by mouth three times a day as needed for anxiety Prescriptions: ALPRAZOLAM 0.25 MG TABS (ALPRAZOLAM) 1 by mouth three times a day as needed for anxiety  #20 x 0   Entered and Authorized by:   Crawford Givens MD   Signed by:   Crawford Givens MD on 07/26/2010   Method used:   Telephoned to ...       [facility] [address] (retail)       [address]       [place], [place]  [address]       Ph: [phone]       Fax: [phone]   RxID:   [id number]

## 2010-09-27 NOTE — Procedures (Signed)
[facility]                                 ULTRASOUND STUDY   HAYDEN, MABIN                      MRN:          [id number]  DATE:12/02/2005                            DOB:          Sep 28, 1955    SESSION NUMBER:  [id number]   CHART NUMBER:  [id number]   READING PHYSICIAN:  Vania Rea. Jarold Motto, MD, Clementeen Graham, FACP   PROCEDURE:  Multiplanar abdominal ultrasound imaging was performed in the  upright, supine, right and left lateral decubitus positions.   RESULTS:  Abdominal aorta normal measuring 1.6 cm.  The IVC is patent.   The pancreas appears normal throughout the head, body and tail without  evidence of ductal dilatation, pancreatic masses, or peripancreatic  inflammation.   Gallbladder normal.  It is well distended, thin walled, with no  pericholecystic fluid or intraluminal echogenic foci to suggest gallstone  disease.   The common bile duct normal.  measures 2.2 mm in maximal diameter without  evidence of intraluminal foci.   The liver appears normal without evidence of parenchymal lesion, ductal  dilatation or vascular abnormality.   Kidneys are normal.  Right 9.5 cm, left 6.2 cm.   Spleen is normal in size, measuring 10.5 cm without parenchymal lesion.   DISCUSSION:  This is a normal upper abdominal ultrasound examination without  evidence of cholelithiasis or any other specific abnormalities.  The liver  and pancreas are well visualized and appear normal.                                   Vania Rea. Jarold Motto, MD, Clementeen Graham, [place]   DRP/MedQ  DD:  12/02/2005  DT:  12/02/2005  Job #:  [id number]   cc:   Marne A. Milinda Antis, MD

## 2010-09-27 NOTE — Assessment & Plan Note (Signed)
[facility]                           GASTROENTEROLOGY OFFICE NOTE   Debra Ford, Debra Ford                      MRN:          [id number]  DATE:12/23/2005                            DOB:          Dec 11, 1955    REFERRING PHYSICIAN:  Marne A. Tower, MD   REASON FOR CONSULTATION:  Elevated liver enzymes.   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 She has  fluctuating liver function tests for the last couple of years, it sounds  like. Most recently she has had abnormal liver chemistries that show  alkaline phosphatase 137, AST 40, ALT 66, total bilirubin 1.3. Her albumin  is normal. Her glucose was 101. Her CBC was normal. Her liver function tests  were normal on February 22nd. Her TSH is normal. Abdominal  ultrasound has  demonstrated a normal abdominal  ultrasound.   RECOMMENDATIONS AND PLAN:  1. She needs a serologic workup for the cause.  2. There is an increased incidence of Celiac Disease in patient's with      Turner's Syndrome and that is one possibility. We will check a SPRUE      panel, will also alpha I, antitrypsin, AMA, ANA, antismooth muscle      antibody, hepatitis B surface antigen, hepatitis C antibody, a ferritin      level and iron saturation and repeat her liver function tests. Further      plans pending this  serologic workup. I will contact her.   Please see my medical history form for full details of this patient's visit.   PAST MEDICAL HISTORY:  1. Turner's Syndrome.  2. Obsessive compulsive disorder  3. Hypothyroidism.  4. Breast  implants.   MEDICATIONS:  Premphase 0.65/5 mg daily, Boniva 150 mg daily, Fluoxetine 40  mg daily, Levothyroxine 50 mcg daily, One a Day vitamin daily, Citracal  daily, vitamin C daily, fiber supplements, Claritin PRN, Nasalcrom PRN.   ALLERGIES:  No known drug allergies.   ADDITIONAL MEDICAL HISTORY:  Additional medical history includes allergies,  osteoporosis.   SOCIAL HISTORY:  She does not drink alcohol. There is no history of  transfusion or drug abuse.   PHYSICAL EXAMINATION:  VITAL SIGNS:  She is a short white woman, height 5', weight 147 pounds,  blood pressure 118/62, pulse 84.   NECK:  There is a low lying neck.   HEENT: Mouth free of lesions. She wears hearing aids. Eyes anicteric.   LUNGS: Clear.   HEART: S1, S2, no rubs or gallops.   ABDOMEN: Soft, nontender without organomegaly or mass.   LYMPHATICS: Neck is free of nodes.   EXTREMITIES: No edema.   SKIN: Multiple nevi with scars from previous resections. Although no  stigmata of chronic liver disease such as palmar erythema, spider angiomata  or prominent abdominal  wall veins.   PSYCH: She is alert and oriented times three.   I appreciate the opportunity to care for this patient. I have reviewed the  office records  and labs kindly sent by Dr. Milinda Antis.  Debra Boop, MD, [facility]   CEG/MedQ  DD:  12/23/2005  DT:  12/24/2005  Job #:  [id number]   cc:   Debra A. Milinda Antis, MD

## 2010-11-14 ENCOUNTER — Other Ambulatory Visit: Payer: Self-pay | Admitting: Family Medicine

## 2010-11-14 DIAGNOSIS — Z1231 Encounter for screening mammogram for malignant neoplasm of breast: Secondary | ICD-10-CM

## 2010-12-25 LAB — HM MAMMOGRAPHY: HM Mammogram: NORMAL

## 2010-12-31 ENCOUNTER — Ambulatory Visit
Admission: RE | Admit: 2010-12-31 | Discharge: 2010-12-31 | Disposition: A | Discharge status: Home / Self Care | Payer: BC Managed Care – PPO | Source: Ambulatory Visit | Attending: Family Medicine | Admitting: Family Medicine

## 2010-12-31 DIAGNOSIS — Z1231 Encounter for screening mammogram for malignant neoplasm of breast: Secondary | ICD-10-CM

## 2011-01-02 ENCOUNTER — Encounter: Payer: Self-pay | Admitting: *Deleted

## 2011-01-21 ENCOUNTER — Other Ambulatory Visit: Payer: Self-pay | Admitting: *Deleted

## 2011-01-21 NOTE — Telephone Encounter (Signed)
Since not on med list please verify with pt

## 2011-01-21 NOTE — Telephone Encounter (Signed)
Spoke with pt and she said Mimvey was to be refilled by Dr Madaline Guthrie at [facility]. Pt asked if I would call [facility] [address] due to her being hearing impaired and give them Dr Corinna Lines name and phone [phone]. I called [facility] [address] and gave info to [facility].

## 2011-01-21 NOTE — Telephone Encounter (Signed)
Received refill request from pharmacy for Mimvey 1-0.5 mg 1 daily. Ok to refill? Not on med list.

## 2011-03-20 ENCOUNTER — Ambulatory Visit: Payer: BC Managed Care – PPO

## 2011-04-10 ENCOUNTER — Telehealth: Payer: Self-pay | Admitting: Family Medicine

## 2011-04-10 DIAGNOSIS — Z0000 Encounter for general adult medical examination without abnormal findings: Secondary | ICD-10-CM

## 2011-04-10 DIAGNOSIS — M949 Disorder of cartilage, unspecified: Secondary | ICD-10-CM

## 2011-04-10 DIAGNOSIS — E039 Hypothyroidism, unspecified: Secondary | ICD-10-CM

## 2011-04-10 NOTE — Telephone Encounter (Signed)
Message copied by Judy Pimple on Thu Apr 10, 2011  6:38 PM ------      Message from: Baldomero Lamy      Created: Tue Apr 08, 2011 10:21 AM      Regarding: Cpx labs Mon 12/3       Please order  future cpx labs for pt's upcomming lab appt.      Thanks      Rodney Booze

## 2011-04-14 ENCOUNTER — Other Ambulatory Visit: Payer: BC Managed Care – PPO

## 2011-04-16 ENCOUNTER — Other Ambulatory Visit (INDEPENDENT_AMBULATORY_CARE_PROVIDER_SITE_OTHER): Payer: BC Managed Care – PPO

## 2011-04-16 DIAGNOSIS — M899 Disorder of bone, unspecified: Secondary | ICD-10-CM

## 2011-04-16 DIAGNOSIS — Z0000 Encounter for general adult medical examination without abnormal findings: Secondary | ICD-10-CM

## 2011-04-16 DIAGNOSIS — E039 Hypothyroidism, unspecified: Secondary | ICD-10-CM

## 2011-04-16 LAB — COMPREHENSIVE METABOLIC PANEL
AST: 36 U/L (ref 0–37)
Calcium: 8.8 mg/dL (ref 8.4–10.5)
Chloride: 102 mEq/L (ref 96–112)
Creatinine, Ser: 0.8 mg/dL (ref 0.4–1.2)
GFR: 82 mL/min (ref >60.00)
Glucose, Bld: 97 mg/dL (ref 70–99)
Potassium: 4.1 mEq/L (ref 3.5–5.1)
Total Bilirubin: 0.9 mg/dL (ref 0.3–1.2)

## 2011-04-16 LAB — LIPID PANEL
Cholesterol: 174 mg/dL (ref 0–200)
LDL Cholesterol: 96 mg/dL (ref 0–99)
VLDL: 22.2 mg/dL (ref 0.0–40.0)

## 2011-04-16 LAB — CBC WITH DIFFERENTIAL/PLATELET
Basophils Absolute: 0 10*3/uL (ref 0.0–0.1)
Hemoglobin: 13.1 g/dL (ref 12.0–15.0)
Lymphs Abs: 1.7 10*3/uL (ref 0.7–4.0)
MCV: 95.6 fl (ref 78.0–100.0)
Monocytes Relative: 8.4 % (ref 3.0–12.0)
Neutro Abs: 5 10*3/uL (ref 1.4–7.7)
Platelets: 229 10*3/uL (ref 150.0–400.0)
RBC: 4 Mil/uL (ref 3.87–5.11)
WBC: 7.6 10*3/uL (ref 4.5–10.5)

## 2011-04-21 ENCOUNTER — Ambulatory Visit (INDEPENDENT_AMBULATORY_CARE_PROVIDER_SITE_OTHER): Payer: BC Managed Care – PPO | Admitting: Family Medicine

## 2011-04-21 ENCOUNTER — Encounter: Payer: Self-pay | Admitting: Internal Medicine

## 2011-04-21 ENCOUNTER — Encounter: Payer: Self-pay | Admitting: Family Medicine

## 2011-04-21 VITALS — BP 114/70 | HR 88 | Temp 97.9°F | Ht 60.0 in | Wt 154.2 lb

## 2011-04-21 DIAGNOSIS — R7402 Elevation of levels of lactic acid dehydrogenase (LDH): Secondary | ICD-10-CM

## 2011-04-21 DIAGNOSIS — M899 Disorder of bone, unspecified: Secondary | ICD-10-CM

## 2011-04-21 DIAGNOSIS — E039 Hypothyroidism, unspecified: Secondary | ICD-10-CM

## 2011-04-21 DIAGNOSIS — Z800 Family history of malignant neoplasm of digestive organs: Secondary | ICD-10-CM | POA: Insufficient documentation

## 2011-04-21 DIAGNOSIS — Z0000 Encounter for general adult medical examination without abnormal findings: Secondary | ICD-10-CM

## 2011-04-21 DIAGNOSIS — Z1211 Encounter for screening for malignant neoplasm of colon: Secondary | ICD-10-CM

## 2011-04-21 DIAGNOSIS — Z23 Encounter for immunization: Secondary | ICD-10-CM

## 2011-04-21 NOTE — Patient Instructions (Signed)
I'm glad you had a flu shot today  We will schedule colonoscopy at check out  Please let me know in the future when your last bone density test was

## 2011-04-21 NOTE — Assessment & Plan Note (Signed)
tsh stable  No symptoms Also followed by gyn/ endo  Nl exam  No change in dose

## 2011-04-21 NOTE — Assessment & Plan Note (Signed)
Up a bit with 4 lb wt gain  May also be linked to Turner's syndrome  Will continue to follow  Asymptomatic

## 2011-04-21 NOTE — Assessment & Plan Note (Signed)
Reviewed health habits including diet and exercise and skin cancer prevention Also reviewed health mt list, fam hx and immunizations  Rev wellness labs in detail Flu shot today

## 2011-04-21 NOTE — Progress Notes (Signed)
Subjective:    Patient ID: Debra Ford, female    DOB: 1955-12-03, 55 y.o.   MRN: [id number]  HPI Here for health maintenance exam and to review chronic medical problems   Has been feeling good  Surgery for hearing done for magnetic implant -- had to straighten canal  earpc is next - will have results tomorrow -- wearing hearing aids today  bp is 114/70 Wt is up 3 lb with bmi of 30 Has gained 4 lb by her scale  She is watching diet - thinks her metabolism has slowed   Has made up her mind to be more faithful at curves  Went this am   Liver enzymes up a bit  Lab Results  Component Value Date   ALT 53* 04/16/2011   AST 36 04/16/2011   ALKPHOS 155* 04/16/2011   BILITOT 0.9 04/16/2011   has been worked up for this in past by GI- Dr Leone Payor- ?never really came up with a reason  Was told at [facility] that Turner's can cause it - by autoimmune factor   Hypothyroid stable Lab Results  Component Value Date   TSH 5.10 04/16/2011   has done well with that - does see endo No symptom   Lipids are good Lab Results  Component Value Date   CHOL 174 04/16/2011   HDL 56.00 04/16/2011   LDLCALC 96 04/16/2011   TRIG 111.0 04/16/2011   CHOLHDL 3 04/16/2011   diet -avoids fatty food except for potato chips - changed to the olestra (no GI side effects)   Exercise = is going to curves now   Gyn exam - was 2 years ago -- , though still sees gyn , she refills her HRT    Colon screen  Wants colonoscopy now - mother was dx with colon cancer - a big surprise   Flu shot- got that today   dexa 1/06 improved Gyn orders them -- she has had one since -- will send for that , and it came out ok  Takes her ca and D  Mam 8/12- came out normal  Gyn does her breast exam  Self exam -no lumps or changes   Also had normal echo - that was reassuring  Patient Active Problem List  Diagnoses  . HYPOTHYROIDISM NOS  . OBSESSIVE-COMPULSIVE DISORDER  . LOM  . DECREASED HEARING  . HEMORRHOIDS,  INTERNAL  . OSTEOPENIA  . TURNER'S SYNDROME  . ELEVATION, TRANSAMINASE/LDH LEVELS  . Routine general medical examination at a health care facility  . Family history of colon cancer  . Colon cancer screening   Past Medical History  Diagnosis Date  . Hyperthyroidism   . Hearing loss   . OCD (obsessive compulsive disorder)   . Turner's syndrome   . Osteopenia   . Elevated liver function tests    Past Surgical History  Procedure Date  . Breast enhancement surgery   . Cholecystectomy 03/2006  . Maxum     hearing surgery   History  Substance Use Topics  . Smoking status: Never Smoker   . Smokeless tobacco: Not on file  . Alcohol Use: Not on file   Family History  Problem Relation Age of Onset  . Diabetes Mother   . Cancer Mother     colon CA  . Colon cancer Mother   . Diabetes Father   . Cancer Father     prostate CA  . Colon polyps Father    No Known Allergies No current  outpatient prescriptions on file prior to visit.      Review of Systems Review of Systems  Constitutional: Negative for fever, appetite change, fatigue and unexpected weight change.  Eyes: Negative for pain and visual disturbance.  ENT pos for hearing loss  Respiratory: Negative for cough and shortness of breath.   Cardiovascular: Negative for cp or palpitations    Gastrointestinal: Negative for nausea, diarrhea and constipation.  Genitourinary: Negative for urgency and frequency.  Skin: Negative for pallor or rash   Neurological: Negative for weakness, light-headedness, numbness and headaches.  Hematological: Negative for adenopathy. Does not bruise/bleed easily.  Psychiatric/Behavioral: Negative for dysphoric mood. The patient is not nervous/anxious.          Objective:   Physical Exam  Constitutional: She appears well-developed and well-nourished. No distress.  HENT:  Head: Normocephalic and atraumatic.  Right Ear: External ear normal.  Left Ear: External ear normal.  Nose: Nose  normal.  Mouth/Throat: Oropharynx is clear and moist.  Eyes: Conjunctivae and EOM are normal. Pupils are equal, round, and reactive to light. No scleral icterus.  Neck: Normal range of motion. Neck supple. No JVD present. Carotid bruit is not present. No thyromegaly present.  Cardiovascular: Normal rate, regular rhythm, normal heart sounds and intact distal pulses.  Exam reveals no gallop.   Pulmonary/Chest: Effort normal and breath sounds normal. No respiratory distress. She has no wheezes.  Abdominal: Soft. Bowel sounds are normal. She exhibits no distension, no abdominal bruit and no mass. There is no tenderness.  Musculoskeletal: Normal range of motion. She exhibits no edema and no tenderness.  Lymphadenopathy:    She has no cervical adenopathy.  Neurological: She is alert. She has normal reflexes. No cranial nerve deficit. She exhibits normal muscle tone. Coordination normal.  Skin: Skin is warm and dry. No rash noted. No erythema. No pallor.       lentigos noted   Psychiatric: She has a normal mood and affect.          Assessment & Plan:

## 2011-04-21 NOTE — Assessment & Plan Note (Signed)
Pt certain her dexa is utd and ok  Takes her ca and D and getting back to regular wt bearing exercise  tsh also tx

## 2011-04-21 NOTE — Assessment & Plan Note (Signed)
Now with family hx of colon cancer (in mother)- pt is open to colonoscopy -- and will schedule that on way out today No bowel changes or symptoms

## 2011-04-21 NOTE — Assessment & Plan Note (Signed)
Ref for colonosc  

## 2011-05-16 ENCOUNTER — Other Ambulatory Visit: Payer: Self-pay | Admitting: Family Medicine

## 2011-05-23 ENCOUNTER — Encounter: Payer: Self-pay | Admitting: Internal Medicine

## 2011-05-23 ENCOUNTER — Ambulatory Visit (AMBULATORY_SURGERY_CENTER): Payer: BC Managed Care – PPO | Admitting: *Deleted

## 2011-05-23 VITALS — Ht 60.0 in | Wt 153.3 lb

## 2011-05-23 DIAGNOSIS — Z1211 Encounter for screening for malignant neoplasm of colon: Secondary | ICD-10-CM

## 2011-05-23 MED ORDER — PEG-KCL-NACL-NASULF-NA ASC-C 100 G PO SOLR: ORAL | Status: DC

## 2011-05-26 ENCOUNTER — Other Ambulatory Visit: Payer: Self-pay

## 2011-05-26 MED ORDER — LEVOTHYROXINE SODIUM 50 MCG PO TABS: 50.0000 ug | ORAL_TABLET | Freq: Every day | ORAL | Status: DC

## 2011-05-26 MED ORDER — FLUOXETINE HCL 40 MG PO CAPS: 40.0000 mg | ORAL_CAPSULE | Freq: Every day | ORAL | Status: DC

## 2011-05-26 NOTE — Telephone Encounter (Signed)
Pt sent fax request 3 month rx instead of monthly. Pt requesting refills on Levothyroxine and Fluoxetine. Pt stated on fax she took her last Fluoxetine 40 mg this AM. Is it OK to fill Fluoxetine 40 mg #90? Fax is on your shelf for review.

## 2011-05-26 NOTE — Telephone Encounter (Signed)
That is fine - I hope that her pharmacy is the one listed because neither this note nor the fax specify which pharmacy to send to  Will refill electronically to [facility] [address]- hope that is right!

## 2011-05-27 NOTE — Telephone Encounter (Signed)
Patient notified as instructed by telephone. [facility] [address] is appropriate pharmacy.

## 2011-06-06 ENCOUNTER — Other Ambulatory Visit: Payer: BC Managed Care – PPO | Admitting: Internal Medicine

## 2011-06-13 ENCOUNTER — Ambulatory Visit (INDEPENDENT_AMBULATORY_CARE_PROVIDER_SITE_OTHER): Payer: BC Managed Care – PPO | Admitting: Family Medicine

## 2011-06-13 ENCOUNTER — Encounter: Payer: Self-pay | Admitting: Family Medicine

## 2011-06-13 VITALS — BP 120/80 | HR 76 | Temp 98.1°F | Wt 153.8 lb

## 2011-06-13 DIAGNOSIS — H6690 Otitis media, unspecified, unspecified ear: Secondary | ICD-10-CM

## 2011-06-13 DIAGNOSIS — J069 Acute upper respiratory infection, unspecified: Secondary | ICD-10-CM

## 2011-06-13 MED ORDER — AMOXICILLIN 500 MG PO CAPS: 500.0000 mg | ORAL_CAPSULE | Freq: Three times a day (TID) | ORAL | Status: AC

## 2011-06-13 NOTE — Progress Notes (Signed)
Subjective:    Patient ID: Debra Ford, female    DOB: [birth year], 56 y.o.   MRN: [id number]  HPI Thinks she has an ear infx on L  Had a bad cold for 3-4 days  Ear feels like a lot of pressure/ ringing and dull ache No fever  No aches or chills   This ear has been operated on before  Is prone to OM in both- but mainly the L  Wears hearing aides- baseline poor hearing , but now worse than usual  Stuffy nose - is yellow No facial pain A little cough   otc is taking dayquil and nyquil - no side effects- this helps a bit with her symptoms  Patient Active Problem List  Diagnoses  . HYPOTHYROIDISM NOS  . OBSESSIVE-COMPULSIVE DISORDER  . LOM  . DECREASED HEARING  . HEMORRHOIDS, INTERNAL  . OSTEOPENIA  . TURNER'S SYNDROME  . ELEVATION, TRANSAMINASE/LDH LEVELS  . Routine general medical examination at a health care facility  . Family history of colon cancer  . Colon cancer screening  . Viral URI   Past Medical History  Diagnosis Date  . Hyperthyroidism   . Hearing loss   . OCD (obsessive compulsive disorder)   . Turner's syndrome   . Osteopenia   . Elevated liver function tests    Past Surgical History  Procedure Date  . Breast enhancement surgery 1988    bilateral  . Maxum 2012    hearing surgery   History  Substance Use Topics  . Smoking status: Never Smoker   . Smokeless tobacco: Never Used  . Alcohol Use: No   Family History  Problem Relation Age of Onset  . Diabetes Mother   . Cancer Mother     colon CA  . Colon cancer Mother 27  . Diabetes Father   . Cancer Father     prostate CA  . Colon polyps Father 14  . Stomach cancer Neg Hx    No Known Allergies Current Outpatient Prescriptions on File Prior to Visit  Medication Sig Dispense Refill  . CALCIUM CITRATE-VITAMIN D PO Take 2 tablets by mouth daily.        . cholecalciferol (VITAMIN D) 1000 UNITS tablet Take 1,000 Units by mouth daily.      Marland Kitchen estradiol (ESTRACE) 1 MG tablet Take 1 mg by  mouth daily. With medroxyprogesterone       . fexofenadine (ALLEGRA) 180 MG tablet Take 180 mg by mouth daily.      Marland Kitchen FLUoxetine (PROZAC) 40 MG capsule Take 1 capsule (40 mg total) by mouth daily.  90 capsule  3  . levothyroxine (SYNTHROID, LEVOTHROID) 50 MCG tablet Take 1 tablet (50 mcg total) by mouth daily.  90 tablet  3  . Lutein 20 MG CAPS Take by mouth daily.      . medroxyPROGESTERone (PROVERA) 2.5 MG tablet Take 2.5 mg by mouth daily. With Estradiol       . Multiple Vitamin (MULTIVITAMIN) tablet Take 1 tablet by mouth daily.        . Multiple Vitamins-Minerals (PRESERVISION/LUTEIN PO) Take 1 tablet by mouth daily.        . Omega-3 Fatty Acids (FISH OIL) 1000 MG CAPS Take 1 capsule by mouth daily.        . peg 3350 powder (MOVIPREP) 100 G SOLR moviprep as directed  1 kit  0      Review of Systems Review of Systems  Constitutional: Negative for fever, appetite change,  and unexpected weight change. pos for fatigue ENT pos for rhiniorrhea/ congestion/ st, neg for facial pain , pos for ear pain and hearing loss , neg for ear d/c Eyes: Negative for pain and visual disturbance.  Respiratory: Negative for cough and shortness of breath.   Cardiovascular: Negative for cp or palpitations    Gastrointestinal: Negative for nausea, diarrhea and constipation.  Genitourinary: Negative for urgency and frequency.  Skin: Negative for pallor or rash   Neurological: Negative for weakness, light-headedness, numbness and headaches.  Hematological: Negative for adenopathy. Does not bruise/bleed easily.  Psychiatric/Behavioral: Negative for dysphoric mood. The patient is not nervous/anxious.          Objective:   Physical Exam  Constitutional: She appears well-developed and well-nourished. No distress.  HENT:  Head: Normocephalic and atraumatic.  Right Ear: External ear normal.  Mouth/Throat: Oropharynx is clear and moist.       bilat TMs are scarred  R TM slt retracted - otherwise nl app L TM  - effusion with mild erythema and loss of light reflex (no canal findings or tenderness) Nares are injected and congested  No sinus tenderness  Throat- clear post nasal drip   Wearing hearing aides -- poor hearing baseline- worse than usual today  Eyes: Conjunctivae and EOM are normal. Pupils are equal, round, and reactive to light. Right eye exhibits no discharge. Left eye exhibits no discharge.  Neck: Normal range of motion. Neck supple.  Cardiovascular: Normal rate, regular rhythm and normal heart sounds.   Pulmonary/Chest: Breath sounds normal. No respiratory distress. She has no wheezes. She has no rales.  Lymphadenopathy:    She has no cervical adenopathy.  Neurological: She is alert. No cranial nerve deficit.  Skin: Skin is warm and dry. No rash noted.  Psychiatric: She has a normal mood and affect.          Assessment & Plan:

## 2011-06-13 NOTE — Assessment & Plan Note (Signed)
General head cold with recurrent OM L  This tx with amox For uri Disc symptomatic care - see instructions on AVS --- nyquil and dayquil/ nasal saline Disc that this may last several weeks  Update if not starting to improve in a week or if worsening

## 2011-06-13 NOTE — Patient Instructions (Signed)
Take the amoxicillin for ear infection (this is mild and early)  For the cold- dayquil and nyquil are ok You can also use saline nasal spray for congestion  Drink lots of fluids and get rest  Aleve may help the congestion and pain also  Update if not starting to improve in a week or if worsening

## 2011-06-13 NOTE — Assessment & Plan Note (Signed)
In pt with viral uri -- and hx of ear surgery in the past -- (this ear is prone to infx and effusion)  tx with amoxicillin and update  Disc symptomatic care - see instructions on AVS

## 2011-08-13 ENCOUNTER — Ambulatory Visit (AMBULATORY_SURGERY_CENTER): Payer: BC Managed Care – PPO | Admitting: Internal Medicine

## 2011-08-13 ENCOUNTER — Encounter: Payer: Self-pay | Admitting: Internal Medicine

## 2011-08-13 VITALS — BP 128/75 | HR 78 | Temp 96.9°F | Resp 21 | Ht 60.0 in | Wt 153.0 lb

## 2011-08-13 DIAGNOSIS — D126 Benign neoplasm of colon, unspecified: Secondary | ICD-10-CM

## 2011-08-13 DIAGNOSIS — Z800 Family history of malignant neoplasm of digestive organs: Secondary | ICD-10-CM

## 2011-08-13 DIAGNOSIS — Z1211 Encounter for screening for malignant neoplasm of colon: Secondary | ICD-10-CM

## 2011-08-13 MED ORDER — SODIUM CHLORIDE 0.9 % IV SOLN: 500.0000 mL | INTRAVENOUS | Status: DC

## 2011-08-13 NOTE — Patient Instructions (Signed)
YOU HAD AN ENDOSCOPIC PROCEDURE TODAY AT THE [facility]: Refer to the procedure report that was given to you for any specific questions about what was found during the examination.  If the procedure report does not answer your questions, please call your gastroenterologist to clarify.  If you requested that your care partner not be given the details of your procedure findings, then the procedure report has been included in a sealed envelope for you to review at your convenience later.  YOU SHOULD EXPECT: Some feelings of bloating in the abdomen. Passage of more gas than usual.  Walking can help get rid of the air that was put into your GI tract during the procedure and reduce the bloating. If you had a lower endoscopy (such as a colonoscopy or flexible sigmoidoscopy) you may notice spotting of blood in your stool or on the toilet paper. If you underwent a bowel prep for your procedure, then you may not have a normal bowel movement for a few days.  DIET: Your first meal following the procedure should be a light meal and then it is ok to progress to your normal diet.  A half-sandwich or bowl of soup is an example of a good first meal.  Heavy or fried foods are harder to digest and may make you feel nauseous or bloated.  Likewise meals heavy in dairy and vegetables can cause extra gas to form and this can also increase the bloating.  Drink plenty of fluids but you should avoid alcoholic beverages for 24 hours.  ACTIVITY: Your care partner should take you home directly after the procedure.  You should plan to take it easy, moving slowly for the rest of the day.  You can resume normal activity the day after the procedure however you should NOT DRIVE or use heavy machinery for 24 hours (because of the sedation medicines used during the test).    SYMPTOMS TO REPORT IMMEDIATELY: A gastroenterologist can be reached at any hour.  During normal business hours, 8:30 AM to 5:00 PM Monday through Friday,  call [phone].  After hours and on weekends, please call the GI answering service at [phone] who will take a message and have the physician on call contact you.   Following lower endoscopy (colonoscopy or flexible sigmoidoscopy):  Excessive amounts of blood in the stool  Significant tenderness or worsening of abdominal pains  Swelling of the abdomen that is new, acute  Fever of 100F or higher    FOLLOW UP: If any biopsies were taken you will be contacted by phone or by letter within the next 1-3 weeks.  Call your gastroenterologist if you have not heard about the biopsies in 3 weeks.  Our staff will call the home number listed on your records the next business day following your procedure to check on you and address any questions or concerns that you may have at that time regarding the information given to you following your procedure. This is a courtesy call and so if there is no answer at the home number and we have not heard from you through the emergency physician on call, we will assume that you have returned to your regular daily activities without incident.  SIGNATURES/CONFIDENTIALITY: You and/or your care partner have signed paperwork which will be entered into your electronic medical record.  These signatures attest to the fact that that the information above on your After Visit Summary has been reviewed and is understood.  Full responsibility of the confidentiality   of this discharge information lies with you and/or your care-partner.     

## 2011-08-13 NOTE — Progress Notes (Signed)
The pt tolerated the colonoscopy very well. Maw   

## 2011-08-13 NOTE — Progress Notes (Signed)
Patient did not experience any of the following events: a burn prior to discharge; a fall within the facility; wrong site/side/patient/procedure/implant event; or a hospital transfer or hospital admission upon discharge from the facility. (G8907) Patient did not have preoperative order for IV antibiotic SSI prophylaxis. (G8918)  

## 2011-08-13 NOTE — Op Note (Signed)
[facility] [address] [place], [place]  [address]  COLONOSCOPY PROCEDURE REPORT  PATIENT:  Debra, Ford  MR#:  [id number] BIRTHDATE:  [birth year], 55 yrs. old  GENDER:  female ENDOSCOPIST:  Hedwig Morton. Juanda Chance, MD REF. BY:  Marne A. Milinda Antis, M.D. PROCEDURE DATE:  08/13/2011 PROCEDURE:  Colonoscopy with snare polypectomy ASA CLASS:  Class I INDICATIONS:  Elevated Risk Screening mother with colon cancer age 48 MEDICATIONS:   MAC sedation, administered by CRNA, propofol (Diprivan) 180 mg  DESCRIPTION OF PROCEDURE:   After the risks and benefits and of the procedure were explained, informed consent was obtained. Digital rectal exam was performed and revealed no rectal masses. The LB 180AL [id number] endoscope was introduced through the anus and advanced to the cecum, which was identified by both the appendix and ileocecal valve.  The quality of the prep was good, using MoviPrep.  The instrument was then slowly withdrawn as the colon was fully examined. <<PROCEDUREIMAGES>>  FINDINGS:  Two polyps were found. 8-10 mm polyps at 10 cm, sessile Polyps were snared without cautery. Retrieval was successful (see image2, image8, image7, and image6). snare polyp  Mild diverticulosis was found in the sigmoid colon (see image3).  This was otherwise a normal examination of the colon (see image4, image5, and image9).   Retroflexed views in the rectum revealed no abnormalities.    The scope was then withdrawn from the patient and the procedure completed.  COMPLICATIONS:  None ENDOSCOPIC IMPRESSION: 1) Two polyps 2) Mild diverticulosis in the sigmoid colon 3) Otherwise normal examination RECOMMENDATIONS: 1) Await pathology results 2) High fiber diet.  REPEAT EXAM:  In 5 year(s) for.  ______________________________ Hedwig Morton. Juanda Chance, MD  CC:  n. eSIGNED:   Hedwig Morton. Tonji Elliff at 08/13/2011 09:54 AM  Ronette Deter, [phone]

## 2011-08-14 ENCOUNTER — Telehealth: Payer: Self-pay | Admitting: *Deleted

## 2011-08-14 NOTE — Telephone Encounter (Signed)
  Follow up Call-  Call back number 08/13/2011  Post procedure Call Back phone  # [phone] husband [phone]  Permission to leave phone message Yes     Patient questions:  Do you have a fever, pain , or abdominal swelling? no Pain Score  0 *  Have you tolerated food without any problems? yes  Have you been able to return to your normal activities? yes  Do you have any questions about your discharge instructions: Diet   no Medications  no Follow up visit  no  Do you have questions or concerns about your Care? no  Actions: * If pain score is 4 or above: No action needed, pain <4.

## 2011-08-18 ENCOUNTER — Encounter: Payer: Self-pay | Admitting: Internal Medicine

## 2012-01-26 ENCOUNTER — Other Ambulatory Visit: Payer: Self-pay | Admitting: Family Medicine

## 2012-01-26 DIAGNOSIS — Z1231 Encounter for screening mammogram for malignant neoplasm of breast: Secondary | ICD-10-CM

## 2012-02-02 ENCOUNTER — Other Ambulatory Visit: Payer: Self-pay | Admitting: *Deleted

## 2012-02-02 MED ORDER — ALPRAZOLAM 0.5 MG PO TABS: 0.5000 mg | ORAL_TABLET | Freq: Two times a day (BID) | ORAL | Status: DC | PRN

## 2012-02-02 NOTE — Telephone Encounter (Signed)
Patient states she would like to have a prescription for Xanax to have on hand just in case.  Her mother is declining with Stage 4 colon cancer.  [facility] [place] [address] [employer]

## 2012-02-02 NOTE — Telephone Encounter (Signed)
I'm so sorry to hear that, we can give a px for use in times of severe anxiety when she does not need to drive Just use caution with it  Px written for call in

## 2012-02-03 NOTE — Telephone Encounter (Signed)
Notified pt's spouse that we called in xanax but advise him to let pt know to take it with great caution, no driving and only use when anxiety is really severe

## 2012-02-03 NOTE — Telephone Encounter (Signed)
Called in Rx as prescribed and left a message with pt's family for her to call the office

## 2012-02-11 ENCOUNTER — Ambulatory Visit
Admission: RE | Admit: 2012-02-11 | Discharge: 2012-02-11 | Disposition: A | Discharge status: Home / Self Care | Payer: BC Managed Care – PPO | Source: Ambulatory Visit | Attending: Family Medicine | Admitting: Family Medicine

## 2012-02-11 DIAGNOSIS — Z1231 Encounter for screening mammogram for malignant neoplasm of breast: Secondary | ICD-10-CM

## 2012-02-12 ENCOUNTER — Encounter: Payer: Self-pay | Admitting: *Deleted

## 2012-03-08 ENCOUNTER — Encounter: Payer: Self-pay | Admitting: *Deleted

## 2012-05-06 ENCOUNTER — Other Ambulatory Visit: Payer: Self-pay | Admitting: *Deleted

## 2012-05-06 MED ORDER — ESTRADIOL 1 MG PO TABS: 1.0000 mg | ORAL_TABLET | Freq: Every day | ORAL | Status: DC

## 2012-05-06 MED ORDER — MEDROXYPROGESTERONE ACETATE 2.5 MG PO TABS: 2.5000 mg | ORAL_TABLET | Freq: Every day | ORAL | Status: DC

## 2012-05-06 NOTE — Telephone Encounter (Signed)
Pt request these meds refilled. They were initially prescribed by Dr Pennelope Bracken who now primarily practices in [place], Pt wants to start getting these meds filled by you now. Is it ok to refill?

## 2012-05-06 NOTE — Telephone Encounter (Signed)
Go ahead and refil for 1 year-thanks

## 2012-05-22 ENCOUNTER — Other Ambulatory Visit: Payer: Self-pay | Admitting: Family Medicine

## 2012-05-24 ENCOUNTER — Other Ambulatory Visit: Payer: Self-pay | Admitting: *Deleted

## 2012-05-24 MED ORDER — FLUOXETINE HCL 40 MG PO CAPS: 40.0000 mg | ORAL_CAPSULE | Freq: Every day | ORAL | Status: DC

## 2012-05-24 NOTE — Telephone Encounter (Signed)
Ok to refill 

## 2012-05-24 NOTE — Telephone Encounter (Signed)
Please refil for 12 months thanks

## 2012-06-11 ENCOUNTER — Other Ambulatory Visit: Payer: Self-pay | Admitting: Family Medicine

## 2012-06-11 ENCOUNTER — Other Ambulatory Visit: Payer: Self-pay | Admitting: *Deleted

## 2012-06-11 MED ORDER — LEVOTHYROXINE SODIUM 50 MCG PO TABS: 50.0000 ug | ORAL_TABLET | Freq: Every day | ORAL | Status: DC

## 2012-06-22 ENCOUNTER — Telehealth: Payer: Self-pay | Admitting: Family Medicine

## 2012-06-22 DIAGNOSIS — Z0000 Encounter for general adult medical examination without abnormal findings: Secondary | ICD-10-CM

## 2012-06-22 NOTE — Telephone Encounter (Signed)
Message copied by Judy Pimple on Tue Jun 22, 2012  5:46 PM ------      Message from: Alvina Chou      Created: Mon Jun 14, 2012  4:21 PM      Regarding: Lab orders for Wednesday, 2.12.14       Patient is scheduled for CPX labs, please order future labs, Thanks , Terri       ------

## 2012-06-23 ENCOUNTER — Other Ambulatory Visit: Payer: BC Managed Care – PPO

## 2012-06-30 ENCOUNTER — Ambulatory Visit (INDEPENDENT_AMBULATORY_CARE_PROVIDER_SITE_OTHER): Payer: BC Managed Care – PPO | Admitting: Family Medicine

## 2012-06-30 ENCOUNTER — Encounter: Payer: Self-pay | Admitting: Family Medicine

## 2012-06-30 ENCOUNTER — Other Ambulatory Visit (HOSPITAL_COMMUNITY)
Admission: RE | Admit: 2012-06-30 | Discharge: 2012-06-30 | Disposition: A | Discharge status: Home / Self Care | Payer: BC Managed Care – PPO | Source: Ambulatory Visit | Attending: Family Medicine | Admitting: Family Medicine

## 2012-06-30 VITALS — BP 130/74 | HR 75 | Temp 98.0°F | Ht 59.75 in | Wt 156.8 lb

## 2012-06-30 DIAGNOSIS — M949 Disorder of cartilage, unspecified: Secondary | ICD-10-CM

## 2012-06-30 DIAGNOSIS — Z0000 Encounter for general adult medical examination without abnormal findings: Secondary | ICD-10-CM

## 2012-06-30 DIAGNOSIS — Z1151 Encounter for screening for human papillomavirus (HPV): Secondary | ICD-10-CM | POA: Insufficient documentation

## 2012-06-30 DIAGNOSIS — Z23 Encounter for immunization: Secondary | ICD-10-CM

## 2012-06-30 DIAGNOSIS — Z01419 Encounter for gynecological examination (general) (routine) without abnormal findings: Secondary | ICD-10-CM

## 2012-06-30 DIAGNOSIS — E039 Hypothyroidism, unspecified: Secondary | ICD-10-CM

## 2012-06-30 DIAGNOSIS — M899 Disorder of bone, unspecified: Secondary | ICD-10-CM

## 2012-06-30 LAB — LIPID PANEL
HDL: 61.6 mg/dL (ref >39.00)
LDL Cholesterol: 96 mg/dL (ref 0–99)
Total CHOL/HDL Ratio: 3
Triglycerides: 151 mg/dL — ABNORMAL HIGH (ref 0.0–149.0)

## 2012-06-30 LAB — COMPREHENSIVE METABOLIC PANEL
ALT: 57 U/L — ABNORMAL HIGH (ref 0–35)
AST: 40 U/L — ABNORMAL HIGH (ref 0–37)
CO2: 30 mEq/L (ref 19–32)
Chloride: 100 mEq/L (ref 96–112)
GFR: 90.99 mL/min (ref >60.00)
Sodium: 137 mEq/L (ref 135–145)
Total Bilirubin: 0.9 mg/dL (ref 0.3–1.2)
Total Protein: 6.9 g/dL (ref 6.0–8.3)

## 2012-06-30 MED ORDER — LEVOTHYROXINE SODIUM 50 MCG PO TABS: 50.0000 ug | ORAL_TABLET | Freq: Every day | ORAL | Status: DC

## 2012-06-30 MED ORDER — FLUOXETINE HCL 40 MG PO CAPS: 40.0000 mg | ORAL_CAPSULE | Freq: Every day | ORAL | Status: DC

## 2012-06-30 MED ORDER — ESTRADIOL 1 MG PO TABS: 1.0000 mg | ORAL_TABLET | Freq: Every day | ORAL | Status: DC

## 2012-06-30 NOTE — Patient Instructions (Addendum)
Tetanus shot (Tdap) today  Pap done today Labs today  We will refer you for bone density test at check out  Take care of yourself

## 2012-06-30 NOTE — Assessment & Plan Note (Signed)
Pap and exam done Pt has Turner's syndrome On est/prog since age 57 Disc pros/cons after menop She will decide when she wants to stop it / aware of breast ca risk

## 2012-06-30 NOTE — Assessment & Plan Note (Signed)
dexa scheduled  Disc ca and D Check D level No falls or fx

## 2012-06-30 NOTE — Assessment & Plan Note (Signed)
In pt with Turner's syndrome tsh today No symptoms

## 2012-06-30 NOTE — Assessment & Plan Note (Signed)
Reviewed health habits including diet and exercise and skin cancer prevention Also reviewed health mt list, fam hx and immunizations   Labs today for wellness Tdap

## 2012-06-30 NOTE — Progress Notes (Signed)
Subjective:    Patient ID: Debra Ford, female    DOB: [birth year], 57 y.o.   MRN: [id number]  HPI Here for health maintenance exam and to review chronic medical problems    Wt is up 3 lb with bmi of 30   Is doing well overall   Pap- was seeing gyn at [facility] and closed clinic  Is on HRT- est and progesterone and she wants to continue taking them  She has some hairs on her face that bother her  Not ready to stop this yet  No hx of abnormal pap   Td 3/04- will get that today Due for 10 year  mammo 10/13 Self exam -no lumps or problems   colonosc 4/13- found polyps - will have 5 year recall  Mother had colon cancer  (doing well with mets and holding steady)  Cbc and lft done at [facility]  Alk phos 171 -not far from her baseline  Is treating fungal toenail with lamasil and doing well    Osteopenia- has been more than 2 years  dexa- wants to do at [facility]  Is taking calcium and vitamin D     Has Turner's syndrome  Hypothyroid - due for tsh   Mood -is overall good   Patient Active Problem List  Diagnosis  . HYPOTHYROIDISM NOS  . OBSESSIVE-COMPULSIVE DISORDER  . LOM  . DECREASED HEARING  . HEMORRHOIDS, INTERNAL  . OSTEOPENIA  . TURNER'S SYNDROME  . ELEVATION, TRANSAMINASE/LDH LEVELS  . Routine general medical examination at a health care facility  . Family history of colon cancer  . Colon cancer screening   Past Medical History  Diagnosis Date  . Hyperthyroidism   . Hearing loss   . OCD (obsessive compulsive disorder)   . Turner's syndrome   . Osteopenia   . Elevated liver function tests    Past Surgical History  Procedure Laterality Date  . Breast enhancement surgery  1988    bilateral  . Maxum  2012    hearing surgery   History  Substance Use Topics  . Smoking status: Never Smoker   . Smokeless tobacco: Never Used  . Alcohol Use: No   Family History  Problem Relation Age of Onset  . Diabetes Mother   . Cancer Mother     colon CA  .  Colon cancer Mother 60  . Diabetes Father   . Cancer Father     prostate CA  . Colon polyps Father 20  . Stomach cancer Neg Hx   . Esophageal cancer Neg Hx   . Rectal cancer Neg Hx    No Known Allergies Current Outpatient Prescriptions on File Prior to Visit  Medication Sig Dispense Refill  . ALPRAZolam (XANAX) 0.5 MG tablet Take 1 tablet (0.5 mg total) by mouth 2 (two) times daily as needed for anxiety (as needed for severe anxiety- caution of sedation).  20 tablet  0  . CALCIUM CITRATE-VITAMIN D PO Take 2 tablets by mouth daily.        . cholecalciferol (VITAMIN D) 1000 UNITS tablet Take 1,000 Units by mouth daily.      Marland Kitchen estradiol (ESTRACE) 1 MG tablet Take 1 tablet (1 mg total) by mouth daily. With medroxyprogesterone  30 tablet  11  . fexofenadine (ALLEGRA) 180 MG tablet Take 180 mg by mouth daily.      Marland Kitchen FLUoxetine (PROZAC) 40 MG capsule Take 1 capsule (40 mg total) by mouth daily.  90 capsule  0  .  levothyroxine (SYNTHROID, LEVOTHROID) 50 MCG tablet Take 1 tablet (50 mcg total) by mouth daily.  90 tablet  1  . levothyroxine (SYNTHROID, LEVOTHROID) 50 MCG tablet TAKE ONE TABLET BY MOUTH EVERY DAY  90 tablet  0  . Lutein 20 MG CAPS Take by mouth daily.      . medroxyPROGESTERone (PROVERA) 2.5 MG tablet Take 1 tablet (2.5 mg total) by mouth daily. With Estradiol  30 tablet  11  . Multiple Vitamin (MULTIVITAMIN) tablet Take 1 tablet by mouth daily.        . Multiple Vitamins-Minerals (PRESERVISION/LUTEIN PO) Take 1 tablet by mouth daily.        . Omega-3 Fatty Acids (FISH OIL) 1000 MG CAPS Take 1 capsule by mouth daily.         No current facility-administered medications on file prior to visit.         Review of Systems Review of Systems  Constitutional: Negative for fever, appetite change, fatigue and unexpected weight change.  Eyes: Negative for pain and visual disturbance.  Respiratory: Negative for cough and shortness of breath.   Cardiovascular: Negative for cp or  palpitations    Gastrointestinal: Negative for nausea, diarrhea and constipation.  Genitourinary: Negative for urgency and frequency.  Skin: Negative for pallor or rash   Neurological: Negative for weakness, light-headedness, numbness and headaches.  Hematological: Negative for adenopathy. Does not bruise/bleed easily.  Psychiatric/Behavioral: Negative for dysphoric mood. The patient is not nervous/anxious.         Objective:   Physical Exam  Constitutional: She appears well-developed and well-nourished. No distress.  HENT:  Head: Normocephalic and atraumatic.  Right Ear: External ear normal.  Left Ear: External ear normal.  Nose: Nose normal.  Mouth/Throat: Oropharynx is clear and moist. No oropharyngeal exudate.  Eyes: Conjunctivae and EOM are normal. Pupils are equal, round, and reactive to light. Right eye exhibits no discharge. Left eye exhibits no discharge. No scleral icterus.  Neck: Normal range of motion. Neck supple. No JVD present. Carotid bruit is not present. No thyromegaly present.  Cardiovascular: Normal rate, regular rhythm, normal heart sounds and intact distal pulses.  Exam reveals no gallop.   Pulmonary/Chest: Effort normal and breath sounds normal. No respiratory distress. She has no wheezes. She exhibits no tenderness.  Abdominal: Soft. Bowel sounds are normal. She exhibits no distension, no abdominal bruit and no mass. There is no tenderness.  Genitourinary: Vagina normal and uterus normal. No breast swelling, tenderness, discharge or bleeding. There is no rash, tenderness or lesion on the right labia. There is no rash, tenderness or lesion on the left labia. Uterus is not enlarged and not tender. Cervix exhibits no motion tenderness, no discharge and no friability. Right adnexum displays no mass, no tenderness and no fullness. Left adnexum displays no mass, no tenderness and no fullness. No bleeding around the vagina.  Breast exam: No mass, nodules, thickening,  tenderness, bulging, retraction, inflamation, nipple discharge or skin changes noted.  No axillary or clavicular LA.  Chaperoned exam.   (pt has breast implants)  Musculoskeletal: Normal range of motion. She exhibits no edema and no tenderness.  Lymphadenopathy:    She has no cervical adenopathy.  Neurological: She is alert. She has normal reflexes. No cranial nerve deficit. She exhibits normal muscle tone. Coordination normal.  Skin: Skin is warm and dry. No rash noted. No erythema. No pallor.  Psychiatric: She has a normal mood and affect.          Assessment &  Plan:

## 2012-06-30 NOTE — Assessment & Plan Note (Signed)
Chronic elevated alk phos  Was 171 at last podiatry visit No symptoms  Continue to watch

## 2012-07-01 ENCOUNTER — Encounter: Payer: Self-pay | Admitting: *Deleted

## 2012-07-01 LAB — VITAMIN D 25 HYDROXY (VIT D DEFICIENCY, FRACTURES): Vit D, 25-Hydroxy: 34 ng/mL (ref 30–89)

## 2012-07-02 ENCOUNTER — Ambulatory Visit (INDEPENDENT_AMBULATORY_CARE_PROVIDER_SITE_OTHER): Payer: BC Managed Care – PPO | Admitting: Family Medicine

## 2012-07-02 ENCOUNTER — Encounter: Payer: Self-pay | Admitting: Family Medicine

## 2012-07-02 VITALS — BP 132/86 | HR 76 | Temp 98.4°F | Ht 59.75 in | Wt 156.8 lb

## 2012-07-02 DIAGNOSIS — L500 Allergic urticaria: Secondary | ICD-10-CM

## 2012-07-02 NOTE — Patient Instructions (Addendum)
I think you have hives from an allergic reaction This could possibly be from your tetanus shot  If you get worse or if you have swelling of the mouth or any wheezing let us know  Hold allegra and get zyrtec 10 mg over the counter- take once daily  Start the zyrtec today  Stay cool (hot makes itch worse)  Avoid scents/ perfumes  Update if not starting to improve in a week or if worsening

## 2012-07-04 NOTE — Assessment & Plan Note (Signed)
Best guess is rxn to Tdap-no s/s/ of angioedema at all  Adv to change allegra to zyrtec Keep cool Avoid fragrances and detergents Update if not imp in a week or if worse or new symptoms

## 2012-07-04 NOTE — Progress Notes (Signed)
Subjective:    Patient ID: Debra Ford, female    DOB: [birth year], 57 y.o.   MRN: [id number]  HPI Here for rash/ hives Started this am - new whelps - come and go and big ones are itchy Up to neck - ? If scalp Spares palms and soles   No tick bites or insect bites On lamasil for 1 mo Also took an otc gas pill No new products Did have a Tdap when here for PE recently  She takes allegra 180 daily No mouth or throat swelling or wheeze  Feels fine No fever or symptoms of illness  Patient Active Problem List  Diagnosis  . HYPOTHYROIDISM NOS  . OBSESSIVE-COMPULSIVE DISORDER  . LOM  . DECREASED HEARING  . HEMORRHOIDS, INTERNAL  . OSTEOPENIA  . TURNER'S SYNDROME  . ELEVATION, TRANSAMINASE/LDH LEVELS  . Routine general medical examination at a health care facility  . Family history of colon cancer  . Colon cancer screening  . Encounter for routine gynecological examination  . Allergic urticaria   Past Medical History  Diagnosis Date  . Hyperthyroidism   . Hearing loss   . OCD (obsessive compulsive disorder)   . Turner's syndrome   . Osteopenia   . Elevated liver function tests    Past Surgical History  Procedure Laterality Date  . Breast enhancement surgery  1988    bilateral  . Maxum  2012    hearing surgery   History  Substance Use Topics  . Smoking status: Never Smoker   . Smokeless tobacco: Never Used  . Alcohol Use: No   Family History  Problem Relation Age of Onset  . Diabetes Mother   . Cancer Mother     colon CA  . Colon cancer Mother 77  . Diabetes Father   . Cancer Father     prostate CA  . Colon polyps Father 22  . Stomach cancer Neg Hx   . Esophageal cancer Neg Hx   . Rectal cancer Neg Hx    Allergies  Allergen Reactions  . Tetanus Toxoids     Hives (?)   Current Outpatient Prescriptions on File Prior to Visit  Medication Sig Dispense Refill  . ALPRAZolam (XANAX) 0.5 MG tablet Take 1 tablet (0.5 mg total) by mouth 2 (two) times  daily as needed for anxiety (as needed for severe anxiety- caution of sedation).  20 tablet  0  . CALCIUM CITRATE-VITAMIN D PO Take 2 tablets by mouth daily.        . cholecalciferol (VITAMIN D) 1000 UNITS tablet Take 1,000 Units by mouth daily.      Marland Kitchen estradiol (ESTRACE) 1 MG tablet Take 1 tablet (1 mg total) by mouth daily. With medroxyprogesterone  90 tablet  3  . fexofenadine (ALLEGRA) 180 MG tablet Take 180 mg by mouth daily.      Marland Kitchen FLUoxetine (PROZAC) 40 MG capsule Take 1 capsule (40 mg total) by mouth daily.  90 capsule  3  . levothyroxine (SYNTHROID, LEVOTHROID) 50 MCG tablet Take 1 tablet (50 mcg total) by mouth daily.  90 tablet  3  . Lutein 20 MG CAPS Take by mouth daily.      . medroxyPROGESTERone (PROVERA) 2.5 MG tablet Take 1 tablet (2.5 mg total) by mouth daily. With Estradiol  30 tablet  11  . Multiple Vitamin (MULTIVITAMIN) tablet Take 1 tablet by mouth daily.        . Multiple Vitamins-Minerals (PRESERVISION/LUTEIN PO) Take 1 tablet by mouth daily.        Marland Kitchen  Omega-3 Fatty Acids (FISH OIL) 1000 MG CAPS Take 1 capsule by mouth daily.        Marland Kitchen terbinafine (LAMISIL) 250 MG tablet Take 250 mg by mouth daily.       No current facility-administered medications on file prior to visit.       Review of Systems Review of Systems  Constitutional: Negative for fever, appetite change, fatigue and unexpected weight change.  Eyes: Negative for pain and visual disturbance.  Respiratory: Negative for cough and shortness of breath.   Cardiovascular: Negative for cp or palpitations    Gastrointestinal: Negative for nausea, diarrhea and constipation.  Genitourinary: Negative for urgency and frequency.  Skin: Negative for pallor and pos for rash/ hives , no reaction at site of Tdap Neurological: Negative for weakness, light-headedness, numbness and headaches.  Hematological: Negative for adenopathy. Does not bruise/bleed easily.  Psychiatric/Behavioral: Negative for dysphoric mood. The  patient is not nervous/anxious.         Objective:   Physical Exam  Constitutional: She appears well-developed and well-nourished. No distress.  HENT:  Head: Normocephalic and atraumatic.  Mouth/Throat: Oropharynx is clear and moist.  No swelling of mouth or throat  Eyes: Conjunctivae and EOM are normal. Pupils are equal, round, and reactive to light. Right eye exhibits no discharge. Left eye exhibits no discharge. No scleral icterus.  Neck: Normal range of motion. Neck supple. No tracheal deviation present. No thyromegaly present.  Pulmonary/Chest: Effort normal and breath sounds normal. No respiratory distress. She has no wheezes. She has no rales.  Lymphadenopathy:    She has no cervical adenopathy.  Neurological: She is alert.  Skin: Skin is warm and dry. Rash noted. There is erythema.  Urticaria with whelps of different sizes- from neck down/ spares palms and soles, no other lesions  No excoriations No shot rxn seen  Psychiatric: She has a normal mood and affect.          Assessment & Plan:

## 2012-07-09 ENCOUNTER — Inpatient Hospital Stay: Admission: RE | Admit: 2012-07-09 | Payer: BC Managed Care – PPO | Source: Ambulatory Visit

## 2012-07-14 ENCOUNTER — Other Ambulatory Visit: Payer: Self-pay | Admitting: Family Medicine

## 2012-07-14 MED ORDER — MEDROXYPROGESTERONE ACETATE 2.5 MG PO TABS: 2.5000 mg | ORAL_TABLET | Freq: Every day | ORAL | Status: DC

## 2012-08-13 ENCOUNTER — Encounter: Payer: Self-pay | Admitting: Family Medicine

## 2012-08-13 ENCOUNTER — Ambulatory Visit (INDEPENDENT_AMBULATORY_CARE_PROVIDER_SITE_OTHER): Payer: BC Managed Care – PPO | Admitting: Family Medicine

## 2012-08-13 VITALS — BP 110/70 | HR 83 | Temp 98.0°F | Ht 59.75 in | Wt 160.5 lb

## 2012-08-13 DIAGNOSIS — N950 Postmenopausal bleeding: Secondary | ICD-10-CM

## 2012-08-13 NOTE — Progress Notes (Signed)
Subjective:    Patient ID: Debra Ford, female    DOB: 09/26/1955, 57 y.o.   MRN: [id number]  HPI Here for breakthrough menstrual bleeding   Started spotting and bleeding about 8 days ago Few days quite heavy  She has been on estrogen and progesterone for years   She was on premphase - and price doubled 3 years ago -- and has been on this dose for 3 years without problems Last spotting was in 20s  Takes both meds every day Did not miss a dose   No big weight changes No stress changes   Had some menstrual cramps when she was bleeding   Patient Active Problem List  Diagnosis  . HYPOTHYROIDISM NOS  . OBSESSIVE-COMPULSIVE DISORDER  . LOM  . DECREASED HEARING  . HEMORRHOIDS, INTERNAL  . OSTEOPENIA  . TURNER'S SYNDROME  . ELEVATION, TRANSAMINASE/LDH LEVELS  . Routine general medical examination at a health care facility  . Family history of colon cancer  . Colon cancer screening  . Encounter for routine gynecological examination  . Allergic urticaria   Past Medical History  Diagnosis Date  . Hyperthyroidism   . Hearing loss   . OCD (obsessive compulsive disorder)   . Turner's syndrome   . Osteopenia   . Elevated liver function tests    Past Surgical History  Procedure Laterality Date  . Breast enhancement surgery  1988    bilateral  . Maxum  2012    hearing surgery   History  Substance Use Topics  . Smoking status: Never Smoker   . Smokeless tobacco: Never Used  . Alcohol Use: No   Family History  Problem Relation Age of Onset  . Diabetes Mother   . Cancer Mother     colon CA  . Colon cancer Mother 26  . Diabetes Father   . Cancer Father     prostate CA  . Colon polyps Father 57  . Stomach cancer Neg Hx   . Esophageal cancer Neg Hx   . Rectal cancer Neg Hx    Allergies  Allergen Reactions  . Tetanus Toxoids     Hives (?)   Current Outpatient Prescriptions on File Prior to Visit  Medication Sig Dispense Refill  . ALPRAZolam (XANAX) 0.5 MG  tablet Take 1 tablet (0.5 mg total) by mouth 2 (two) times daily as needed for anxiety (as needed for severe anxiety- caution of sedation).  20 tablet  0  . CALCIUM CITRATE-VITAMIN D PO Take 2 tablets by mouth daily.        . cholecalciferol (VITAMIN D) 1000 UNITS tablet Take 1,000 Units by mouth daily.      Marland Kitchen estradiol (ESTRACE) 1 MG tablet Take 1 tablet (1 mg total) by mouth daily. With medroxyprogesterone  90 tablet  3  . fexofenadine (ALLEGRA) 180 MG tablet Take 180 mg by mouth daily.      Marland Kitchen FLUoxetine (PROZAC) 40 MG capsule Take 1 capsule (40 mg total) by mouth daily.  90 capsule  3  . levothyroxine (SYNTHROID, LEVOTHROID) 50 MCG tablet Take 1 tablet (50 mcg total) by mouth daily.  90 tablet  3  . Lutein 20 MG CAPS Take by mouth daily.      . medroxyPROGESTERone (PROVERA) 2.5 MG tablet Take 1 tablet (2.5 mg total) by mouth daily. With Estradiol  90 tablet  2  . Multiple Vitamin (MULTIVITAMIN) tablet Take 1 tablet by mouth daily.        . Multiple Vitamins-Minerals (PRESERVISION/LUTEIN  PO) Take 1 tablet by mouth daily.        . Omega-3 Fatty Acids (FISH OIL) 1000 MG CAPS Take 1 capsule by mouth daily.        Marland Kitchen terbinafine (LAMISIL) 250 MG tablet Take 250 mg by mouth daily.       No current facility-administered medications on file prior to visit.      Review of Systems Review of Systems  Constitutional: Negative for fever, appetite change, fatigue and unexpected weight change.  Eyes: Negative for pain and visual disturbance.  Respiratory: Negative for cough and shortness of breath.   Cardiovascular: Negative for cp or palpitations    Gastrointestinal: Negative for nausea, diarrhea and constipation.  Genitourinary: Negative for urgency and frequency. pos for cramping with her vaginal bleeding  Skin: Negative for pallor or rash   Neurological: Negative for weakness, light-headedness, numbness and headaches.  Hematological: Negative for adenopathy. Does not bruise/bleed easily.   Psychiatric/Behavioral: Negative for dysphoric mood. The patient is not nervous/anxious.         Objective:   Physical Exam  Constitutional: She appears well-developed and well-nourished. No distress.  overwt and well appearing   HENT:  Head: Normocephalic and atraumatic.  Mouth/Throat: Oropharynx is clear and moist.  Eyes: Conjunctivae and EOM are normal. Pupils are equal, round, and reactive to light. No scleral icterus.  Neck: Normal range of motion. Neck supple. No thyromegaly present.  Cardiovascular: Normal rate, regular rhythm and normal heart sounds.   Pulmonary/Chest: Effort normal and breath sounds normal. No respiratory distress. She has no wheezes.  Abdominal: Soft. Bowel sounds are normal. She exhibits no distension and no mass. There is no tenderness.  No suprapubic tenderness or fullness    Musculoskeletal: She exhibits no edema.  Lymphadenopathy:    She has no cervical adenopathy.  Neurological: She is alert. She has normal reflexes. No cranial nerve deficit. She exhibits normal muscle tone. Coordination normal.  Skin: Skin is warm and dry. No rash noted. No erythema. No pallor.  Psychiatric: She has a normal mood and affect.          Assessment & Plan:

## 2012-08-13 NOTE — Assessment & Plan Note (Signed)
After being on estrogen/ progesterone most of her life (Turner's syndrome)- and current dose for 3 y Thinks she has had an endometrial bx in the past- unsure -no hx of pathology  No missed doses Will check US pelvic for status of endometrial lining Depending on result , may need bx or may need inc in progesterone dose

## 2012-08-13 NOTE — Patient Instructions (Addendum)
For the bleeding problem- we will send you for a pelvic ultrasound See Shirlee Limerick on the way out to schedule this  If it is normal- we will likely increase your progesterone dose  If symptoms worsen- let me know

## 2012-08-20 ENCOUNTER — Encounter: Payer: Self-pay | Admitting: Family Medicine

## 2012-08-20 ENCOUNTER — Telehealth: Payer: Self-pay | Admitting: Family Medicine

## 2012-08-20 ENCOUNTER — Ambulatory Visit (INDEPENDENT_AMBULATORY_CARE_PROVIDER_SITE_OTHER): Payer: BC Managed Care – PPO | Admitting: Family Medicine

## 2012-08-20 VITALS — BP 150/90 | HR 116 | Temp 98.2°F | Ht 59.75 in | Wt 157.0 lb

## 2012-08-20 DIAGNOSIS — N950 Postmenopausal bleeding: Secondary | ICD-10-CM

## 2012-08-20 NOTE — Progress Notes (Signed)
Subjective:    Patient ID: Debra Ford, female    DOB: 1955-06-15, 57 y.o.   MRN: [id number]  HPI Here for post menopausal bleeding - woke up at midnight and she had to change clothes Is bleeding every day - it stopped briefly and then came back  Has cramps -not severe  She has never had bleeding like this with blood and also clots   Ultrasound is Monday   Takes estrace 1 mg and takes provera 2.5 mg   Wonders if generic lamasil could affect her ?   Patient Active Problem List  Diagnosis  . HYPOTHYROIDISM NOS  . OBSESSIVE-COMPULSIVE DISORDER  . LOM  . DECREASED HEARING  . HEMORRHOIDS, INTERNAL  . OSTEOPENIA  . TURNER'S SYNDROME  . ELEVATION, TRANSAMINASE/LDH LEVELS  . Routine general medical examination at a health care facility  . Family history of colon cancer  . Colon cancer screening  . Encounter for routine gynecological examination  . Allergic urticaria  . Post-menopausal bleeding   Past Medical History  Diagnosis Date  . Hyperthyroidism   . Hearing loss   . OCD (obsessive compulsive disorder)   . Turner's syndrome   . Osteopenia   . Elevated liver function tests    Past Surgical History  Procedure Laterality Date  . Breast enhancement surgery  1988    bilateral  . Maxum  2012    hearing surgery   History  Substance Use Topics  . Smoking status: Never Smoker   . Smokeless tobacco: Never Used  . Alcohol Use: No   Family History  Problem Relation Age of Onset  . Diabetes Mother   . Cancer Mother     colon CA  . Colon cancer Mother 49  . Diabetes Father   . Cancer Father     prostate CA  . Colon polyps Father 31  . Stomach cancer Neg Hx   . Esophageal cancer Neg Hx   . Rectal cancer Neg Hx    Allergies  Allergen Reactions  . Tetanus Toxoids     Hives (?)   Current Outpatient Prescriptions on File Prior to Visit  Medication Sig Dispense Refill  . ALPRAZolam (XANAX) 0.5 MG tablet Take 1 tablet (0.5 mg total) by mouth 2 (two) times daily  as needed for anxiety (as needed for severe anxiety- caution of sedation).  20 tablet  0  . CALCIUM CITRATE-VITAMIN D PO Take 2 tablets by mouth daily.        . cholecalciferol (VITAMIN D) 1000 UNITS tablet Take 1,000 Units by mouth daily.      Marland Kitchen estradiol (ESTRACE) 1 MG tablet Take 1 tablet (1 mg total) by mouth daily. With medroxyprogesterone  90 tablet  3  . fexofenadine (ALLEGRA) 180 MG tablet Take 180 mg by mouth daily.      Marland Kitchen FLUoxetine (PROZAC) 40 MG capsule Take 1 capsule (40 mg total) by mouth daily.  90 capsule  3  . levothyroxine (SYNTHROID, LEVOTHROID) 50 MCG tablet Take 1 tablet (50 mcg total) by mouth daily.  90 tablet  3  . Lutein 20 MG CAPS Take by mouth daily.      . medroxyPROGESTERone (PROVERA) 2.5 MG tablet Take 1 tablet (2.5 mg total) by mouth daily. With Estradiol  90 tablet  2  . Multiple Vitamin (MULTIVITAMIN) tablet Take 1 tablet by mouth daily.        . Multiple Vitamins-Minerals (PRESERVISION/LUTEIN PO) Take 1 tablet by mouth daily.        Marland Kitchen  Omega-3 Fatty Acids (FISH OIL) 1000 MG CAPS Take 1 capsule by mouth daily.        Marland Kitchen terbinafine (LAMISIL) 250 MG tablet Take 250 mg by mouth daily.       No current facility-administered medications on file prior to visit.     Review of Systems Review of Systems  Constitutional: Negative for fever, appetite change, fatigue and unexpected weight change.  Eyes: Negative for pain and visual disturbance.  Respiratory: Negative for cough and shortness of breath.   Cardiovascular: Negative for cp or palpitations    Gastrointestinal: Negative for nausea, diarrhea and constipation.  Genitourinary: Negative for urgency and frequency. pos for vaginal bleeding and some menstrual cramps Skin: Negative for pallor or rash   Neurological: Negative for weakness, light-headedness, numbness and headaches.  Hematological: Negative for adenopathy. Does not bruise/bleed easily.  Psychiatric/Behavioral: Negative for dysphoric mood. The patient is  not nervous/anxious.         Objective:   Physical Exam  Constitutional: She appears well-developed and well-nourished. No distress.  HENT:  Head: Normocephalic and atraumatic.  Mouth/Throat: Oropharynx is clear and moist.  Eyes: Conjunctivae and EOM are normal. Pupils are equal, round, and reactive to light. No scleral icterus.  Neck: Normal range of motion. Neck supple. Carotid bruit is not present. No thyromegaly present.  Cardiovascular: Normal rate, regular rhythm and normal heart sounds.   Pulmonary/Chest: Effort normal and breath sounds normal.  Abdominal: Soft. Bowel sounds are normal. She exhibits no distension, no abdominal bruit and no mass. There is tenderness. There is no guarding.  Mild suprapubic tenderness without fullness  Musculoskeletal: She exhibits no edema.  Lymphadenopathy:    She has no cervical adenopathy.  Neurological: She is alert. She has normal reflexes.  Skin: Skin is warm and dry. No pallor.  Psychiatric: She has a normal mood and affect.          Assessment & Plan:

## 2012-08-20 NOTE — Telephone Encounter (Signed)
error 

## 2012-08-20 NOTE — Assessment & Plan Note (Signed)
Pt has not had her ultrasound yet- planned for Monday, but her bleeding increased  Adv to inc provera to 5 mg daily along with her estrogen  Update if worse over weekend  Will see how results are and go from there- may end up needing to see a gyn in [facility]

## 2012-08-20 NOTE — Patient Instructions (Addendum)
Increase your progesterone (medroxyprogesterone/provera) from 2.5 to 5 mg daily (that is 2 pills once daily)- to see if that slows down the bleeding Have your ultrasound Monday as planned If symptoms worsen before that- let us know

## 2012-08-22 ENCOUNTER — Emergency Department: Payer: Self-pay | Admitting: Emergency Medicine

## 2012-08-22 LAB — URINALYSIS, COMPLETE
Ketone: NEGATIVE
Specific Gravity: 1.006 (ref 1.003–1.030)
WBC UR: 23 /HPF (ref 0–5)

## 2012-08-23 ENCOUNTER — Ambulatory Visit (INDEPENDENT_AMBULATORY_CARE_PROVIDER_SITE_OTHER): Payer: BC Managed Care – PPO | Admitting: Family Medicine

## 2012-08-23 ENCOUNTER — Encounter: Payer: Self-pay | Admitting: Family Medicine

## 2012-08-23 VITALS — BP 142/74 | HR 83 | Temp 98.7°F | Ht 59.75 in | Wt 159.5 lb

## 2012-08-23 DIAGNOSIS — N950 Postmenopausal bleeding: Secondary | ICD-10-CM

## 2012-08-23 LAB — CBC
HGB: 12.1 g/dL (ref 12.0–16.0)
MCH: 32.2 pg (ref 26.0–34.0)
MCHC: 33.7 g/dL (ref 32.0–36.0)
RBC: 3.74 10*6/uL — ABNORMAL LOW (ref 3.80–5.20)
RDW: 12.8 % (ref 11.5–14.5)

## 2012-08-23 LAB — BASIC METABOLIC PANEL
Anion Gap: 5 — ABNORMAL LOW (ref 7–16)
Calcium, Total: 8.8 mg/dL (ref 8.5–10.1)
Chloride: 107 mmol/L (ref 98–107)
Creatinine: 0.62 mg/dL (ref 0.60–1.30)
Glucose: 98 mg/dL (ref 65–99)

## 2012-08-23 NOTE — Progress Notes (Signed)
Subjective:    Patient ID: Debra Ford, female    DOB: 1956-05-05, 57 y.o.   MRN: [id number]  HPI Here for vaginal bleeding - started bleeding heavily and she had to go to the ER  Did ultrasound - tech told her it looked like thick endometrium  Is passing clots  Today- flow is lighter  Did not give her any meds in the ER [facility])   Wants to see gyn as soon as possible   She prefers [facility] - but requests first avail     Patient Active Problem List  Diagnosis  . HYPOTHYROIDISM NOS  . OBSESSIVE-COMPULSIVE DISORDER  . LOM  . DECREASED HEARING  . HEMORRHOIDS, INTERNAL  . OSTEOPENIA  . TURNER'S SYNDROME  . ELEVATION, TRANSAMINASE/LDH LEVELS  . Routine general medical examination at a health care facility  . Family history of colon cancer  . Colon cancer screening  . Encounter for routine gynecological examination  . Allergic urticaria  . Post-menopausal bleeding   Past Medical History  Diagnosis Date  . Hyperthyroidism   . Hearing loss   . OCD (obsessive compulsive disorder)   . Turner's syndrome   . Osteopenia   . Elevated liver function tests    Past Surgical History  Procedure Laterality Date  . Breast enhancement surgery  1988    bilateral  . Maxum  2012    hearing surgery   History  Substance Use Topics  . Smoking status: Never Smoker   . Smokeless tobacco: Never Used  . Alcohol Use: No   Family History  Problem Relation Age of Onset  . Diabetes Mother   . Cancer Mother     colon CA  . Colon cancer Mother 41  . Diabetes Father   . Cancer Father     prostate CA  . Colon polyps Father 52  . Stomach cancer Neg Hx   . Esophageal cancer Neg Hx   . Rectal cancer Neg Hx    Allergies  Allergen Reactions  . Tetanus Toxoids     Hives (?)   Current Outpatient Prescriptions on File Prior to Visit  Medication Sig Dispense Refill  . ALPRAZolam (XANAX) 0.5 MG tablet Take 1 tablet (0.5 mg total) by mouth 2 (two) times daily as needed for anxiety (as  needed for severe anxiety- caution of sedation).  20 tablet  0  . CALCIUM CITRATE-VITAMIN D PO Take 2 tablets by mouth daily.        . cholecalciferol (VITAMIN D) 1000 UNITS tablet Take 1,000 Units by mouth daily.      Marland Kitchen estradiol (ESTRACE) 1 MG tablet Take 1 tablet (1 mg total) by mouth daily. With medroxyprogesterone  90 tablet  3  . fexofenadine (ALLEGRA) 180 MG tablet Take 180 mg by mouth daily.      Marland Kitchen FLUoxetine (PROZAC) 40 MG capsule Take 1 capsule (40 mg total) by mouth daily.  90 capsule  3  . levothyroxine (SYNTHROID, LEVOTHROID) 50 MCG tablet Take 1 tablet (50 mcg total) by mouth daily.  90 tablet  3  . Lutein 20 MG CAPS Take by mouth daily.      . medroxyPROGESTERone (PROVERA) 2.5 MG tablet Take 5 mg by mouth daily. With Estradiol      . Multiple Vitamin (MULTIVITAMIN) tablet Take 1 tablet by mouth daily.        . Multiple Vitamins-Minerals (PRESERVISION/LUTEIN PO) Take 1 tablet by mouth daily.        . Omega-3 Fatty Acids (FISH  OIL) 1000 MG CAPS Take 1 capsule by mouth daily.        Marland Kitchen terbinafine (LAMISIL) 250 MG tablet Take 250 mg by mouth daily.       No current facility-administered medications on file prior to visit.    Review of Systems Pos for vaginal bleeding , neg for cramps Neg for dizziness or syncope    Objective:   Physical Exam  Constitutional: She appears well-developed.  Skin: Skin is warm and dry. No pallor.  Psychiatric: She has a normal mood and affect.          Assessment & Plan:

## 2012-08-23 NOTE — Assessment & Plan Note (Signed)
For 2 weeks - now heavy enough for pt to go to ER last night [facility])  Pt has Turner's syndrome and has been on est/ prog for years I do not have report yet - but was given verbal reading of [place] - and she had thickened endometrium Will send for info from [facility] and ref to gyn asap  Pt will alert me if bleeding gets heavier or if she develops dizziness/etc

## 2012-08-23 NOTE — Patient Instructions (Addendum)
Please request ER records - from [facility] from this weekend - including ultrasound report also  We will do gyn referral at check out - see Shirlee Limerick

## 2012-08-26 ENCOUNTER — Ambulatory Visit: Payer: Self-pay | Admitting: Obstetrics & Gynecology

## 2012-08-26 LAB — CBC
MCH: 32.1 pg (ref 26.0–34.0)
MCHC: 34 g/dL (ref 32.0–36.0)
MCV: 94 fL (ref 80–100)
Platelet: 250 10*3/uL (ref 150–440)
RDW: 12.8 % (ref 11.5–14.5)

## 2012-08-26 LAB — PREGNANCY, URINE: Pregnancy Test, Urine: NEGATIVE m[IU]/mL

## 2012-08-26 LAB — PROTIME-INR
INR: 1
Prothrombin Time: 12.9 secs (ref 11.5–14.7)

## 2012-08-27 ENCOUNTER — Ambulatory Visit: Payer: Self-pay | Admitting: Obstetrics & Gynecology

## 2012-08-30 LAB — PATHOLOGY REPORT

## 2012-09-11 ENCOUNTER — Other Ambulatory Visit: Payer: Self-pay | Admitting: Family Medicine

## 2013-01-28 ENCOUNTER — Other Ambulatory Visit: Payer: Self-pay | Admitting: *Deleted

## 2013-01-28 MED ORDER — ALPRAZOLAM 0.5 MG PO TABS: 0.5000 mg | ORAL_TABLET | Freq: Two times a day (BID) | ORAL | Status: DC | PRN

## 2013-01-28 NOTE — Telephone Encounter (Signed)
Pt sent fax requesting refill of xanax 0.5mg , she is stressed due to her mother's declining health from cancer. Pt would like to have med on hand in case she needs it.

## 2013-01-28 NOTE — Telephone Encounter (Signed)
Rx called in as prescribed, pt notified 

## 2013-01-28 NOTE — Telephone Encounter (Signed)
Px written for call in   She uses it seldomly

## 2013-03-14 ENCOUNTER — Other Ambulatory Visit: Payer: Self-pay | Admitting: Family Medicine

## 2013-04-11 ENCOUNTER — Telehealth: Payer: Self-pay | Admitting: *Deleted

## 2013-04-11 NOTE — Telephone Encounter (Signed)
I'm not sure if she is still on that- I sent her to gyn last April and cannot find notes in epic from that - please ask pt what her status is and send for gyn notes from spring - thanks

## 2013-04-11 NOTE — Telephone Encounter (Signed)
Received faxed refill request from pharmacy. Last office visit 08/23/12. Is it okay to refill medication?

## 2013-04-21 MED ORDER — MEDROXYPROGESTERONE ACETATE 2.5 MG PO TABS: 5.0000 mg | ORAL_TABLET | Freq: Every day | ORAL | Status: DC

## 2013-04-21 NOTE — Telephone Encounter (Signed)
Pt is still on medication, pt said she went to GYN once in the spring and they fixed the problems she was having so hasn't went back, I sent for last note and refilled med

## 2013-05-17 ENCOUNTER — Encounter: Payer: Self-pay | Admitting: Family Medicine

## 2013-06-06 ENCOUNTER — Ambulatory Visit (INDEPENDENT_AMBULATORY_CARE_PROVIDER_SITE_OTHER): Payer: BC Managed Care – PPO | Admitting: Podiatry

## 2013-06-06 ENCOUNTER — Encounter: Payer: Self-pay | Admitting: Podiatry

## 2013-06-06 DIAGNOSIS — B351 Tinea unguium: Secondary | ICD-10-CM

## 2013-06-06 MED ORDER — TERBINAFINE HCL 250 MG PO TABS: 250.0000 mg | ORAL_TABLET | Freq: Every day | ORAL | Status: DC

## 2013-06-06 NOTE — Progress Notes (Signed)
Pt states the left big toe the fungus come back as she refers to her hallux nail left.  Objective: Vital signs are stable she is alert and oriented x3. The hallux nail left does demonstrates and distal onychomycosis and probable onychomycosis.  Assessment: Probable onychomycosis hallux left.  Plan: I have reviewed her past medical history medications and allergies we discussed the etiology pathology conservative versus surgical therapies. At this point since no medications have changed since she finished her last Lamisil dose I see no problem giving her another dose of Lamisil. I think a single month for the Lamisil should take care of this. I dispensed a description today to the pharmacy for Lamisil 250 mg 1 by mouth daily x1 month

## 2013-06-26 ENCOUNTER — Telehealth: Payer: Self-pay | Admitting: Family Medicine

## 2013-06-26 DIAGNOSIS — Z0000 Encounter for general adult medical examination without abnormal findings: Secondary | ICD-10-CM

## 2013-06-26 DIAGNOSIS — M949 Disorder of cartilage, unspecified: Principal | ICD-10-CM

## 2013-06-26 DIAGNOSIS — M899 Disorder of bone, unspecified: Secondary | ICD-10-CM

## 2013-06-26 NOTE — Telephone Encounter (Signed)
Message copied by Abner Greenspan on Sun Jun 26, 2013  1:22 PM ------      Message from: Ellamae Sia      Created: Thu Jun 23, 2013 10:59 AM      Regarding: Lab orders for Monday, 2.16.15       Patient is scheduled for CPX labs, please order future labs, Thanks , Terri       ------

## 2013-06-27 ENCOUNTER — Other Ambulatory Visit (INDEPENDENT_AMBULATORY_CARE_PROVIDER_SITE_OTHER): Payer: BC Managed Care – PPO

## 2013-06-27 DIAGNOSIS — R7401 Elevation of levels of liver transaminase levels: Secondary | ICD-10-CM

## 2013-06-27 DIAGNOSIS — R7402 Elevation of levels of lactic acid dehydrogenase (LDH): Secondary | ICD-10-CM

## 2013-06-27 DIAGNOSIS — E039 Hypothyroidism, unspecified: Secondary | ICD-10-CM

## 2013-06-27 DIAGNOSIS — R740 Nonspecific elevation of levels of transaminase and lactic acid dehydrogenase [LDH]: Secondary | ICD-10-CM

## 2013-06-27 DIAGNOSIS — Z0000 Encounter for general adult medical examination without abnormal findings: Secondary | ICD-10-CM

## 2013-06-27 DIAGNOSIS — M899 Disorder of bone, unspecified: Secondary | ICD-10-CM

## 2013-06-27 DIAGNOSIS — M949 Disorder of cartilage, unspecified: Secondary | ICD-10-CM

## 2013-06-27 LAB — CBC WITH DIFFERENTIAL/PLATELET
Basophils Absolute: 0 10*3/uL (ref 0.0–0.1)
Basophils Relative: 0.4 % (ref 0.0–3.0)
EOS PCT: 1.7 % (ref 0.0–5.0)
Eosinophils Absolute: 0.1 10*3/uL (ref 0.0–0.7)
HCT: 39.5 % (ref 36.0–46.0)
Hemoglobin: 13.1 g/dL (ref 12.0–15.0)
LYMPHS ABS: 2 10*3/uL (ref 0.7–4.0)
Lymphocytes Relative: 22.2 % (ref 12.0–46.0)
MCHC: 33.1 g/dL (ref 30.0–36.0)
MCV: 96.2 fl (ref 78.0–100.0)
MONO ABS: 0.7 10*3/uL (ref 0.1–1.0)
Monocytes Relative: 8.2 % (ref 3.0–12.0)
NEUTROS PCT: 67.5 % (ref 43.0–77.0)
Neutro Abs: 6 10*3/uL (ref 1.4–7.7)
PLATELETS: 243 10*3/uL (ref 150.0–400.0)
RBC: 4.1 Mil/uL (ref 3.87–5.11)
RDW: 13 % (ref 11.5–14.6)
WBC: 9 10*3/uL (ref 4.5–10.5)

## 2013-06-27 LAB — TSH: TSH: 2.75 u[IU]/mL (ref 0.35–5.50)

## 2013-06-27 LAB — COMPREHENSIVE METABOLIC PANEL
ALT: 36 U/L — ABNORMAL HIGH (ref 0–35)
AST: 27 U/L (ref 0–37)
Albumin: 3.9 g/dL (ref 3.5–5.2)
Alkaline Phosphatase: 138 U/L — ABNORMAL HIGH (ref 39–117)
BILIRUBIN TOTAL: 0.8 mg/dL (ref 0.3–1.2)
BUN: 11 mg/dL (ref 6–23)
CO2: 29 mEq/L (ref 19–32)
Calcium: 9.3 mg/dL (ref 8.4–10.5)
Chloride: 104 mEq/L (ref 96–112)
Creatinine, Ser: 0.7 mg/dL (ref 0.4–1.2)
GFR: 92.15 mL/min (ref >60.00)
GLUCOSE: 80 mg/dL (ref 70–99)
POTASSIUM: 4.1 meq/L (ref 3.5–5.1)
SODIUM: 139 meq/L (ref 135–145)
Total Protein: 6.4 g/dL (ref 6.0–8.3)

## 2013-06-27 LAB — LIPID PANEL
CHOLESTEROL: 166 mg/dL (ref 0–200)
HDL: 61.1 mg/dL (ref >39.00)
LDL Cholesterol: 76 mg/dL (ref 0–99)
Total CHOL/HDL Ratio: 3
Triglycerides: 145 mg/dL (ref 0.0–149.0)
VLDL: 29 mg/dL (ref 0.0–40.0)

## 2013-06-28 LAB — VITAMIN D 25 HYDROXY (VIT D DEFICIENCY, FRACTURES): VIT D 25 HYDROXY: 48 ng/mL (ref 30–89)

## 2013-07-01 ENCOUNTER — Ambulatory Visit (INDEPENDENT_AMBULATORY_CARE_PROVIDER_SITE_OTHER): Payer: BC Managed Care – PPO | Admitting: Family Medicine

## 2013-07-01 ENCOUNTER — Other Ambulatory Visit: Payer: Self-pay

## 2013-07-01 ENCOUNTER — Encounter: Payer: Self-pay | Admitting: Family Medicine

## 2013-07-01 VITALS — BP 138/90 | HR 72 | Temp 98.0°F | Ht 59.75 in | Wt 155.5 lb

## 2013-07-01 DIAGNOSIS — E039 Hypothyroidism, unspecified: Secondary | ICD-10-CM

## 2013-07-01 DIAGNOSIS — Z9882 Breast implant status: Secondary | ICD-10-CM

## 2013-07-01 DIAGNOSIS — Z0000 Encounter for general adult medical examination without abnormal findings: Secondary | ICD-10-CM

## 2013-07-01 DIAGNOSIS — Z1231 Encounter for screening mammogram for malignant neoplasm of breast: Secondary | ICD-10-CM

## 2013-07-01 DIAGNOSIS — M949 Disorder of cartilage, unspecified: Secondary | ICD-10-CM

## 2013-07-01 DIAGNOSIS — M899 Disorder of bone, unspecified: Secondary | ICD-10-CM

## 2013-07-01 DIAGNOSIS — Q969 Turner's syndrome, unspecified: Secondary | ICD-10-CM

## 2013-07-01 MED ORDER — LEVOTHYROXINE SODIUM 50 MCG PO TABS: ORAL_TABLET | ORAL | Status: DC

## 2013-07-01 MED ORDER — MEDROXYPROGESTERONE ACETATE 2.5 MG PO TABS: 5.0000 mg | ORAL_TABLET | Freq: Every day | ORAL | Status: DC

## 2013-07-01 MED ORDER — ESTRADIOL 1 MG PO TABS
1.0000 mg | ORAL_TABLET | Freq: Every day | ORAL | Status: DC
Start: 2013-07-01 — End: 2014-07-03

## 2013-07-01 MED ORDER — ALPRAZOLAM 0.5 MG PO TABS: 0.5000 mg | ORAL_TABLET | Freq: Two times a day (BID) | ORAL | Status: DC | PRN

## 2013-07-01 MED ORDER — FLUOXETINE HCL 40 MG PO CAPS: 40.0000 mg | ORAL_CAPSULE | Freq: Every day | ORAL | Status: DC

## 2013-07-01 NOTE — Progress Notes (Signed)
Subjective:    Patient ID: Debra Ford, female    DOB: 05-02-1956, 58 y.o.   MRN: [id number]  HPI Here for health maintenance exam and to review chronic medical problems    Has been feeling ok    Mammogram 10/13- she has an appt on March 12- is slow this year   Self exam-no lumps   Pap 2/14 nl - she had some vaginal bleeding and had to have a D and C for a polyp Her pap was normal   Flu shot 8/14 colonosc 4/13 with polyps - 5 year recall - also family hx  No bowel changes    Tdap 2/14  Wt is down 4 lb with bmi of 30  Hypothyroidism  Pt has no clinical changes No change in energy level/ hair or skin/ edema and no tremor Lab Results  Component Value Date   TSH 2.75 06/27/2013     Osteopenia dexa 1/06 She is on estrogen and progesterone - still fine with that and she wants to stay on them - understands risks  D level is 48 Not enough exercise -wants to work on that       Results for orders placed in visit on 06/27/13  CBC WITH DIFFERENTIAL      Result Value Ref Range   WBC 9.0  4.5 - 10.5 K/uL   RBC 4.10  3.87 - 5.11 Mil/uL   Hemoglobin 13.1  12.0 - 15.0 g/dL   HCT 39.5  36.0 - 46.0 %   MCV 96.2  78.0 - 100.0 fl   MCHC 33.1  30.0 - 36.0 g/dL   RDW 13.0  11.5 - 14.6 %   Platelets 243.0  150.0 - 400.0 K/uL   Neutrophils Relative % 67.5  43.0 - 77.0 %   Lymphocytes Relative 22.2  12.0 - 46.0 %   Monocytes Relative 8.2  3.0 - 12.0 %   Eosinophils Relative 1.7  0.0 - 5.0 %   Basophils Relative 0.4  0.0 - 3.0 %   Neutro Abs 6.0  1.4 - 7.7 K/uL   Lymphs Abs 2.0  0.7 - 4.0 K/uL   Monocytes Absolute 0.7  0.1 - 1.0 K/uL   Eosinophils Absolute 0.1  0.0 - 0.7 K/uL   Basophils Absolute 0.0  0.0 - 0.1 K/uL  COMPREHENSIVE METABOLIC PANEL      Result Value Ref Range   Sodium 139  135 - 145 mEq/L   Potassium 4.1  3.5 - 5.1 mEq/L   Chloride 104  96 - 112 mEq/L   CO2 29  19 - 32 mEq/L   Glucose, Bld 80  70 - 99 mg/dL   BUN 11  6 - 23 mg/dL   Creatinine, Ser 0.7   0.4 - 1.2 mg/dL   Total Bilirubin 0.8  0.3 - 1.2 mg/dL   Alkaline Phosphatase 138 (*) 39 - 117 U/L   AST 27  0 - 37 U/L   ALT 36 (*) 0 - 35 U/L   Total Protein 6.4  6.0 - 8.3 g/dL   Albumin 3.9  3.5 - 5.2 g/dL   Calcium 9.3  8.4 - 10.5 mg/dL   GFR 92.15  >60.00 mL/min  LIPID PANEL      Result Value Ref Range   Cholesterol 166  0 - 200 mg/dL   Triglycerides 145.0  0.0 - 149.0 mg/dL   HDL 61.10  >39.00 mg/dL   VLDL 29.0  0.0 - 40.0 mg/dL   LDL Cholesterol 76  0 - 99 mg/dL   Total CHOL/HDL Ratio 3    TSH      Result Value Ref Range   TSH 2.75  0.35 - 5.50 uIU/mL  VITAMIN D 25 HYDROXY      Result Value Ref Range   Vit D, 25-Hydroxy 48  30 - 89 ng/mL   glucose 80  Patient Active Problem List   Diagnosis Date Noted  . Post-menopausal bleeding 08/13/2012  . Allergic urticaria 07/02/2012  . Encounter for routine gynecological examination 06/30/2012  . Family history of colon cancer 04/21/2011  . Colon cancer screening 04/21/2011  . Routine general medical examination at a health care facility 04/10/2011  . LOM 05/14/2009  . ELEVATION, TRANSAMINASE/LDH LEVELS 01/27/2007  . OSTEOPENIA 12/29/2006  . HYPOTHYROIDISM NOS 11/30/2006  . OBSESSIVE-COMPULSIVE DISORDER 11/20/2006  . DECREASED HEARING 11/20/2006  . HEMORRHOIDS, INTERNAL 11/20/2006  . Stamps SYNDROME 11/20/2006   Past Medical History  Diagnosis Date  . Hyperthyroidism   . Hearing loss   . OCD (obsessive compulsive disorder)   . Turner's syndrome   . Osteopenia   . Elevated liver function tests    Past Surgical History  Procedure Laterality Date  . Breast enhancement surgery  1988    bilateral  . Maxum  2012    hearing surgery   History  Substance Use Topics  . Smoking status: Never Smoker   . Smokeless tobacco: Never Used  . Alcohol Use: No   Family History  Problem Relation Age of Onset  . Diabetes Mother   . Cancer Mother     colon CA  . Colon cancer Mother 52  . Diabetes Father   . Cancer Father      prostate CA  . Colon polyps Father 45  . Stomach cancer Neg Hx   . Esophageal cancer Neg Hx   . Rectal cancer Neg Hx    Allergies  Allergen Reactions  . Tetanus Toxoids     Hives (?)   Current Outpatient Prescriptions on File Prior to Visit  Medication Sig Dispense Refill  . ALPRAZolam (XANAX) 0.5 MG tablet Take 1 tablet (0.5 mg total) by mouth 2 (two) times daily as needed for anxiety (as needed for severe anxiety- caution of sedation).  20 tablet  0  . CALCIUM CITRATE-VITAMIN D PO Take 2 tablets by mouth daily.        . cetirizine (ZYRTEC) 10 MG tablet Take 10 mg by mouth daily.      . cholecalciferol (VITAMIN D) 1000 UNITS tablet Take 1,000 Units by mouth daily.      Marland Kitchen estradiol (ESTRACE) 1 MG tablet Take 1 tablet (1 mg total) by mouth daily. With medroxyprogesterone  90 tablet  3  . FLUoxetine (PROZAC) 40 MG capsule Take 1 capsule (40 mg total) by mouth daily.  90 capsule  3  . levothyroxine (SYNTHROID, LEVOTHROID) 50 MCG tablet TAKE ONE TABLET BY MOUTH ONCE DAILY  90 tablet  1  . medroxyPROGESTERone (PROVERA) 2.5 MG tablet Take 2 tablets (5 mg total) by mouth daily. With Estradiol  60 tablet  5  . Multiple Vitamin (MULTIVITAMIN) tablet Take 1 tablet by mouth daily.        . Multiple Vitamins-Minerals (PRESERVISION/LUTEIN PO) Take 1 tablet by mouth daily.        Marland Kitchen terbinafine (LAMISIL) 250 MG tablet Take 1 tablet (250 mg total) by mouth daily.  30 tablet  0   No current facility-administered medications on file prior to visit.  Review of Systems Review of Systems  Constitutional: Negative for fever, appetite change, fatigue and unexpected weight change.  Eyes: Negative for pain and visual disturbance.  Respiratory: Negative for cough and shortness of breath.   Cardiovascular: Negative for cp or palpitations    Gastrointestinal: Negative for nausea, diarrhea and constipation.  Genitourinary: Negative for urgency and frequency.  Skin: Negative for pallor or rash     Neurological: Negative for weakness, light-headedness, numbness and headaches.  Hematological: Negative for adenopathy. Does not bruise/bleed easily.  Psychiatric/Behavioral: Negative for dysphoric mood. The patient is not nervous/anxious.         Objective:   Physical Exam  Constitutional: She appears well-developed and well-nourished. No distress.  obese and well appearing   HENT:  Head: Normocephalic and atraumatic.  Right Ear: External ear normal.  Left Ear: External ear normal.  Mouth/Throat: Oropharynx is clear and moist.  Eyes: Conjunctivae and EOM are normal. Pupils are equal, round, and reactive to light. No scleral icterus.  Neck: Normal range of motion. Neck supple. No JVD present. Carotid bruit is not present. No thyromegaly present.  Cardiovascular: Normal rate, regular rhythm, normal heart sounds and intact distal pulses.  Exam reveals no gallop.   Pulmonary/Chest: Effort normal and breath sounds normal. No respiratory distress. She has no wheezes. She exhibits no tenderness.  Abdominal: Soft. Bowel sounds are normal. She exhibits no distension, no abdominal bruit and no mass. There is no tenderness.  Genitourinary: No breast swelling, tenderness, discharge or bleeding.  Breast exam: No mass, nodules, thickening, tenderness, bulging, retraction, inflamation, nipple discharge or skin changes noted.  No axillary or clavicular LA.  = implants noted   Musculoskeletal: Normal range of motion. She exhibits no edema and no tenderness.  No kyphosis   Lymphadenopathy:    She has no cervical adenopathy.  Neurological: She is alert. She has normal reflexes. No cranial nerve deficit. She exhibits normal muscle tone. Coordination normal.  Skin: Skin is warm and dry. No rash noted. No erythema. No pallor.  Few lentigos   Psychiatric: She has a normal mood and affect.          Assessment & Plan:

## 2013-07-01 NOTE — Patient Instructions (Signed)
Take care of yourself  Stop up front on the way out for referral for bone density test

## 2013-07-01 NOTE — Progress Notes (Signed)
Pre visit review using our clinic review tool, if applicable. No additional management support is needed unless otherwise documented below in the visit note. 

## 2013-07-03 NOTE — Assessment & Plan Note (Signed)
Hypothyroidism  Pt has no clinical changes No change in energy level/ hair or skin/ edema and no tremor Lab Results  Component Value Date   TSH 2.75 06/27/2013    No change in dose

## 2013-07-03 NOTE — Assessment & Plan Note (Signed)
Schedule dexa  Disc need for calcium/ vitamin D/ wt bearing exercise and bone density test every 2 y to monitor Disc safety/ fracture risk in detail    

## 2013-07-03 NOTE — Assessment & Plan Note (Signed)
Reviewed health habits including diet and exercise and skin cancer prevention Reviewed appropriate screening tests for age  Also reviewed health mt list, fam hx and immunization status , as well as social and family history   See HPI Labs reviewed  

## 2013-07-03 NOTE — Assessment & Plan Note (Signed)
Pt takes HRT and also thyroid supplement- no changes in condition

## 2013-07-15 LAB — HM DEXA SCAN

## 2013-07-21 ENCOUNTER — Other Ambulatory Visit: Payer: BC Managed Care – PPO

## 2013-08-01 ENCOUNTER — Encounter: Payer: Self-pay | Admitting: Family Medicine

## 2013-08-01 ENCOUNTER — Encounter: Payer: Self-pay | Admitting: *Deleted

## 2013-08-02 ENCOUNTER — Encounter: Payer: Self-pay | Admitting: Family Medicine

## 2013-09-01 ENCOUNTER — Ambulatory Visit (INDEPENDENT_AMBULATORY_CARE_PROVIDER_SITE_OTHER): Payer: PRIVATE HEALTH INSURANCE | Admitting: Internal Medicine

## 2013-09-01 ENCOUNTER — Encounter: Payer: Self-pay | Admitting: Internal Medicine

## 2013-09-01 VITALS — BP 128/82 | HR 91 | Temp 98.2°F | Wt 158.2 lb

## 2013-09-01 DIAGNOSIS — J309 Allergic rhinitis, unspecified: Secondary | ICD-10-CM

## 2013-09-01 MED ORDER — LEVOCETIRIZINE DIHYDROCHLORIDE 5 MG PO TABS: 5.0000 mg | ORAL_TABLET | Freq: Every evening | ORAL | Status: DC

## 2013-09-01 MED ORDER — HYDROCODONE-HOMATROPINE 5-1.5 MG/5ML PO SYRP: 5.0000 mL | ORAL_SOLUTION | Freq: Three times a day (TID) | ORAL | Status: DC | PRN

## 2013-09-01 NOTE — Progress Notes (Signed)
Pre visit review using our clinic review tool, if applicable. No additional management support is needed unless otherwise documented below in the visit note. 

## 2013-09-01 NOTE — Progress Notes (Signed)
Subjective:    Patient ID: Debra Ford, female    DOB: 02-19-1956, 58 y.o.   MRN: [id number]  HPI  Pt presents to the clinic today with c/o cough, runny nose, and watery eyes. She reports this started 1 week ago. The cough is productive at time, yellow mucous. She denies fever or chills but has had some fatigue and body aches. She has some associated dizziness. She has tried OTC allergy medication and nyquil with minimal relief. She has no history of allergies or breathing problems. She has not had sick contacts.  Review of Systems      Past Medical History  Diagnosis Date  . Hyperthyroidism   . Hearing loss   . OCD (obsessive compulsive disorder)   . Turner's syndrome   . Osteopenia   . Elevated liver function tests     Current Outpatient Prescriptions  Medication Sig Dispense Refill  . ALPRAZolam (XANAX) 0.5 MG tablet Take 1 tablet (0.5 mg total) by mouth 2 (two) times daily as needed for anxiety (as needed for severe anxiety- caution of sedation).  30 tablet  0  . CALCIUM CITRATE-VITAMIN D PO Take 2 tablets by mouth daily.        . cetirizine (ZYRTEC) 10 MG tablet Take 10 mg by mouth daily.      . cholecalciferol (VITAMIN D) 1000 UNITS tablet Take 1,000 Units by mouth daily.      Marland Kitchen estradiol (ESTRACE) 1 MG tablet Take 1 tablet (1 mg total) by mouth daily. With medroxyprogesterone  90 tablet  3  . FLUoxetine (PROZAC) 40 MG capsule Take 1 capsule (40 mg total) by mouth daily.  90 capsule  3  . levothyroxine (SYNTHROID, LEVOTHROID) 50 MCG tablet TAKE ONE TABLET BY MOUTH ONCE DAILY  90 tablet  3  . medroxyPROGESTERone (PROVERA) 2.5 MG tablet Take 2 tablets (5 mg total) by mouth daily. With Estradiol  180 tablet  3  . Multiple Vitamin (MULTIVITAMIN) tablet Take 1 tablet by mouth daily.        . Multiple Vitamins-Minerals (PRESERVISION/LUTEIN PO) Take 1 tablet by mouth daily.        Marland Kitchen terbinafine (LAMISIL) 250 MG tablet Take 1 tablet (250 mg total) by mouth daily.  30 tablet  0     No current facility-administered medications for this visit.    Allergies  Allergen Reactions  . Tetanus Toxoids     Hives (?)    Family History  Problem Relation Age of Onset  . Diabetes Mother   . Cancer Mother     colon CA  . Colon cancer Mother 27  . Diabetes Father   . Cancer Father     prostate CA  . Colon polyps Father 56  . Stomach cancer Neg Hx   . Esophageal cancer Neg Hx   . Rectal cancer Neg Hx     History   Social History  . Marital Status: Married    Spouse Name: N/A    Number of Children: N/A  . Years of Education: N/A   Occupational History  . Not on file.   Social History Main Topics  . Smoking status: Never Smoker   . Smokeless tobacco: Never Used  . Alcohol Use: No  . Drug Use: No  . Sexual Activity: Not on file   Other Topics Concern  . Not on file   Social History Narrative  . No narrative on file     Constitutional: Pt reports headache. Denies fever, malaise,  fatigue, or abrupt weight changes.  HEENT: Pt reports runny nose, watery eyes. Denies eye pain, eye redness, ear pain, ringing in the ears, wax buildup, nasal congestion, bloody nose, or sore throat. Respiratory: Pt reports cough. Denies difficulty breathing, shortness of breath,  or sputum production.   .   No other specific complaints in a complete review of systems (except as listed in HPI above).  Objective:   Physical Exam  BP 128/82  Pulse 91  Temp(Src) 98.2 F (36.8 C) (Oral)  Wt 158 lb 4 oz (71.782 kg)  SpO2 98% Wt Readings from Last 3 Encounters:  09/01/13 158 lb 4 oz (71.782 kg)  07/01/13 155 lb 8 oz (70.534 kg)  08/23/12 159 lb 8 oz (72.349 kg)    General: Appears their stated age, well developed, well nourished in NAD. HEENT: Head: normal shape and size; Eyes: sclera white, no icterus, conjunctiva pink, PERRLA and EOMs intact; Ears: Tm's gray and intact, normal light reflex; Nose: mucosa pink and moist, septum midline; Throat/Mouth: Teeth present,  mucosa pink and moist, no exudate, lesions or ulcerations noted.  Cardiovascular: Normal rate and rhythm. S1,S2 noted.  No murmur, rubs or gallops noted. No JVD or BLE edema. No carotid bruits noted. Pulmonary/Chest: Normal effort and positive vesicular breath sounds. No respiratory distress. No wheezes, rales or ronchi noted.    BMET    Component Value Date/Time   NA 139 06/27/2013 0848   K 4.1 06/27/2013 0848   CL 104 06/27/2013 0848   CO2 29 06/27/2013 0848   GLUCOSE 80 06/27/2013 0848   BUN 11 06/27/2013 0848   CREATININE 0.7 06/27/2013 0848   CALCIUM 9.3 06/27/2013 0848   GFRNONAA 91.80 02/25/2010 0916   GFRAA 85 12/30/2007 1454    Lipid Panel     Component Value Date/Time   CHOL 166 06/27/2013 0848   TRIG 145.0 06/27/2013 0848   HDL 61.10 06/27/2013 0848   CHOLHDL 3 06/27/2013 0848   VLDL 29.0 06/27/2013 0848   LDLCALC 76 06/27/2013 0848    CBC    Component Value Date/Time   WBC 9.0 06/27/2013 0848   RBC 4.10 06/27/2013 0848   HGB 13.1 06/27/2013 0848   HCT 39.5 06/27/2013 0848   PLT 243.0 06/27/2013 0848   MCV 96.2 06/27/2013 0848   MCHC 33.1 06/27/2013 0848   RDW 13.0 06/27/2013 0848   LYMPHSABS 2.0 06/27/2013 0848   MONOABS 0.7 06/27/2013 0848   EOSABS 0.1 06/27/2013 0848   BASOSABS 0.0 06/27/2013 0848    Hgb A1C No results found for this basename: HGBA1C         Assessment & Plan:   Allergic Rhinitis:  Will switch zyrtec to xyzal Try flonase as well-now OTC Hycodan for cough  RTC as needed or if symptoms persist or worsen

## 2013-09-01 NOTE — Patient Instructions (Addendum)
Allergic Rhinitis Allergic rhinitis is when the mucous membranes in the nose respond to allergens. Allergens are particles in the air that cause your body to have an allergic reaction. This causes you to release allergic antibodies. Through a chain of events, these eventually cause you to release histamine into the blood stream. Although meant to protect the body, it is this release of histamine that causes your discomfort, such as frequent sneezing, congestion, and an itchy, runny nose.  CAUSES  Seasonal allergic rhinitis (hay fever) is caused by pollen allergens that may come from grasses, trees, and weeds. Year-round allergic rhinitis (perennial allergic rhinitis) is caused by allergens such as house dust mites, pet dander, and mold spores.  SYMPTOMS   Nasal stuffiness (congestion).  Itchy, runny nose with sneezing and tearing of the eyes. DIAGNOSIS  Your health care provider can help you determine the allergen or allergens that trigger your symptoms. If you and your health care provider are unable to determine the allergen, skin or blood testing may be used. TREATMENT  Allergic Rhinitis does not have a cure, but it can be controlled by:  Medicines and allergy shots (immunotherapy).  Avoiding the allergen. Hay fever may often be treated with antihistamines in pill or nasal spray forms. Antihistamines block the effects of histamine. There are over-the-counter medicines that may help with nasal congestion and swelling around the eyes. Check with your health care provider before taking or giving this medicine.  If avoiding the allergen or the medicine prescribed do not work, there are many new medicines your health care provider can prescribe. Stronger medicine may be used if initial measures are ineffective. Desensitizing injections can be used if medicine and avoidance does not work. Desensitization is when a patient is given ongoing shots until the body becomes less sensitive to the allergen.  Make sure you follow up with your health care provider if problems continue. HOME CARE INSTRUCTIONS It is not possible to completely avoid allergens, but you can reduce your symptoms by taking steps to limit your exposure to them. It helps to know exactly what you are allergic to so that you can avoid your specific triggers. SEEK MEDICAL CARE IF:   You have a fever.  You develop a cough that does not stop easily (persistent).  You have shortness of breath.  You start wheezing.  Symptoms interfere with normal daily activities. Document Released: 01/21/2001 Document Revised: 02/16/2013 Document Reviewed: 01/03/2013 [employer] Patient Information 2014 [employer].  

## 2014-03-02 ENCOUNTER — Other Ambulatory Visit: Payer: Self-pay | Admitting: *Deleted

## 2014-03-02 MED ORDER — MEDROXYPROGESTERONE ACETATE 2.5 MG PO TABS: 2.5000 mg | ORAL_TABLET | Freq: Every day | ORAL | Status: DC

## 2014-04-24 ENCOUNTER — Ambulatory Visit (INDEPENDENT_AMBULATORY_CARE_PROVIDER_SITE_OTHER): Payer: PRIVATE HEALTH INSURANCE | Admitting: Internal Medicine

## 2014-04-24 ENCOUNTER — Encounter: Payer: Self-pay | Admitting: Internal Medicine

## 2014-04-24 VITALS — BP 118/70 | HR 89 | Wt 153.0 lb

## 2014-04-24 DIAGNOSIS — H66009 Acute suppurative otitis media without spontaneous rupture of ear drum, unspecified ear: Secondary | ICD-10-CM | POA: Insufficient documentation

## 2014-04-24 DIAGNOSIS — H66001 Acute suppurative otitis media without spontaneous rupture of ear drum, right ear: Secondary | ICD-10-CM

## 2014-04-24 MED ORDER — AMOXICILLIN 500 MG PO TABS: 1000.0000 mg | ORAL_TABLET | Freq: Two times a day (BID) | ORAL | Status: DC

## 2014-04-24 NOTE — Assessment & Plan Note (Signed)
Has done well with amoxil in past Will try this and then change to augmentin if she doesn't improve

## 2014-04-24 NOTE — Progress Notes (Signed)
Pre visit review using our clinic review tool, if applicable. No additional management support is needed unless otherwise documented below in the visit note. 

## 2014-04-24 NOTE — Progress Notes (Signed)
Subjective:    Patient ID: Debra Ford, female    DOB: June 22, 1955, 58 y.o.   MRN: [id number]  HPI Here with left ear pain  Recent cold--- will have it go to ear infection often at the end Started about a week ago-- congestion, cough, sneezing No fever No SOB Some headache--- better now Sore throat at the beginning--this is better Now with ear congestion and crackling Hearing is off  Tried nyquil at night---did help  Current Outpatient Prescriptions on File Prior to Visit  Medication Sig Dispense Refill  . ALPRAZolam (XANAX) 0.5 MG tablet Take 1 tablet (0.5 mg total) by mouth 2 (two) times daily as needed for anxiety (as needed for severe anxiety- caution of sedation). 30 tablet 0  . CALCIUM CITRATE-VITAMIN D PO Take 2 tablets by mouth daily.      . cholecalciferol (VITAMIN D) 1000 UNITS tablet Take 1,000 Units by mouth daily.    Marland Kitchen estradiol (ESTRACE) 1 MG tablet Take 1 tablet (1 mg total) by mouth daily. With medroxyprogesterone 90 tablet 3  . FLUoxetine (PROZAC) 40 MG capsule Take 1 capsule (40 mg total) by mouth daily. 90 capsule 3  . HYDROcodone-homatropine (HYCODAN) 5-1.5 MG/5ML syrup Take 5 mLs by mouth every 8 (eight) hours as needed for cough. 120 mL 0  . levocetirizine (XYZAL) 5 MG tablet Take 1 tablet (5 mg total) by mouth every evening. 30 tablet 2  . levothyroxine (SYNTHROID, LEVOTHROID) 50 MCG tablet TAKE ONE TABLET BY MOUTH ONCE DAILY 90 tablet 3  . medroxyPROGESTERone (PROVERA) 2.5 MG tablet Take 1 tablet (2.5 mg total) by mouth daily. With Estradiol 90 tablet 1  . Multiple Vitamin (MULTIVITAMIN) tablet Take 1 tablet by mouth daily.      . Multiple Vitamins-Minerals (PRESERVISION/LUTEIN PO) Take 1 tablet by mouth daily.      Marland Kitchen terbinafine (LAMISIL) 250 MG tablet Take 1 tablet (250 mg total) by mouth daily. 30 tablet 0   No current facility-administered medications on file prior to visit.    Allergies  Allergen Reactions  . Tetanus Toxoids     Hives (?)     Past Medical History  Diagnosis Date  . Hyperthyroidism   . Hearing loss   . OCD (obsessive compulsive disorder)   . Turner's syndrome   . Osteopenia   . Elevated liver function tests     Past Surgical History  Procedure Laterality Date  . Breast enhancement surgery  1988    bilateral  . Maxum  2012    hearing surgery    Family History  Problem Relation Age of Onset  . Diabetes Mother   . Cancer Mother     colon CA  . Colon cancer Mother 34  . Diabetes Father   . Cancer Father     prostate CA  . Colon polyps Father 20  . Stomach cancer Neg Hx   . Esophageal cancer Neg Hx   . Rectal cancer Neg Hx     History   Social History  . Marital Status: Married    Spouse Name: N/A    Number of Children: N/A  . Years of Education: N/A   Occupational History  . Not on file.   Social History Main Topics  . Smoking status: Never Smoker   . Smokeless tobacco: Never Used  . Alcohol Use: No  . Drug Use: No  . Sexual Activity: Not on file   Other Topics Concern  . Not on file   Social History  Narrative   Review of Systems  Did have magnet placed behind ear drum to work with her aide Tried some left over ear drops--Dr Thornell Mule had given in past No vomiting or diarrhea     Objective:   Physical Exam  Constitutional: She appears well-developed and well-nourished. No distress.  HENT:  Mouth/Throat: Oropharynx is clear and moist. No oropharyngeal exudate.  No sinus tenderness Mild nasal congestion Right TM normal Left is red/bulging and with effusion  Neck: Normal range of motion. Neck supple.  Pulmonary/Chest: Effort normal and breath sounds normal. No respiratory distress. She has no wheezes. She has no rales.  Lymphadenopathy:    She has no cervical adenopathy.          Assessment & Plan:

## 2014-06-25 ENCOUNTER — Telehealth: Payer: Self-pay | Admitting: Family Medicine

## 2014-06-25 DIAGNOSIS — M8580 Other specified disorders of bone density and structure, unspecified site: Secondary | ICD-10-CM

## 2014-06-25 DIAGNOSIS — Z0000 Encounter for general adult medical examination without abnormal findings: Secondary | ICD-10-CM | POA: Insufficient documentation

## 2014-06-25 NOTE — Telephone Encounter (Signed)
-----   Message from Ellamae Sia sent at 06/22/2014  2:28 PM EST ----- Regarding: Lab orders for Monday, 2.15.16 Patient is scheduled for CPX labs, please order future labs, Thanks , Karna Christmas

## 2014-06-26 ENCOUNTER — Other Ambulatory Visit: Payer: BC Managed Care – PPO

## 2014-07-03 ENCOUNTER — Ambulatory Visit (INDEPENDENT_AMBULATORY_CARE_PROVIDER_SITE_OTHER): Payer: Self-pay | Admitting: Family Medicine

## 2014-07-03 ENCOUNTER — Encounter: Payer: Self-pay | Admitting: Family Medicine

## 2014-07-03 VITALS — BP 122/86 | HR 92 | Temp 97.8°F | Ht 60.0 in | Wt 151.0 lb

## 2014-07-03 DIAGNOSIS — B9789 Other viral agents as the cause of diseases classified elsewhere: Secondary | ICD-10-CM

## 2014-07-03 DIAGNOSIS — J069 Acute upper respiratory infection, unspecified: Secondary | ICD-10-CM | POA: Insufficient documentation

## 2014-07-03 DIAGNOSIS — M8580 Other specified disorders of bone density and structure, unspecified site: Secondary | ICD-10-CM

## 2014-07-03 DIAGNOSIS — E038 Other specified hypothyroidism: Secondary | ICD-10-CM

## 2014-07-03 DIAGNOSIS — Z0000 Encounter for general adult medical examination without abnormal findings: Secondary | ICD-10-CM

## 2014-07-03 DIAGNOSIS — Q969 Turner's syndrome, unspecified: Secondary | ICD-10-CM

## 2014-07-03 LAB — CBC WITH DIFFERENTIAL/PLATELET
BASOS PCT: 0.1 % (ref 0.0–3.0)
Basophils Absolute: 0 10*3/uL (ref 0.0–0.1)
EOS PCT: 0.5 % (ref 0.0–5.0)
Eosinophils Absolute: 0 10*3/uL (ref 0.0–0.7)
HEMATOCRIT: 40.5 % (ref 36.0–46.0)
Hemoglobin: 13.9 g/dL (ref 12.0–15.0)
LYMPHS PCT: 27.9 % (ref 12.0–46.0)
Lymphs Abs: 1.8 10*3/uL (ref 0.7–4.0)
MCHC: 34.3 g/dL (ref 30.0–36.0)
MCV: 92.6 fl (ref 78.0–100.0)
MONOS PCT: 9.3 % (ref 3.0–12.0)
Monocytes Absolute: 0.6 10*3/uL (ref 0.1–1.0)
Neutro Abs: 4 10*3/uL (ref 1.4–7.7)
Neutrophils Relative %: 62.2 % (ref 43.0–77.0)
Platelets: 236 10*3/uL (ref 150.0–400.0)
RBC: 4.37 Mil/uL (ref 3.87–5.11)
RDW: 12.8 % (ref 11.5–15.5)
WBC: 6.5 10*3/uL (ref 4.0–10.5)

## 2014-07-03 LAB — COMPREHENSIVE METABOLIC PANEL
ALBUMIN: 4.1 g/dL (ref 3.5–5.2)
ALT: 30 U/L (ref 0–35)
AST: 25 U/L (ref 0–37)
Alkaline Phosphatase: 141 U/L — ABNORMAL HIGH (ref 39–117)
BUN: 10 mg/dL (ref 6–23)
CALCIUM: 9.7 mg/dL (ref 8.4–10.5)
CO2: 30 mEq/L (ref 19–32)
CREATININE: 0.72 mg/dL (ref 0.40–1.20)
Chloride: 102 mEq/L (ref 96–112)
GFR: 88.88 mL/min (ref >60.00)
GLUCOSE: 86 mg/dL (ref 70–99)
POTASSIUM: 4.2 meq/L (ref 3.5–5.1)
Sodium: 139 mEq/L (ref 135–145)
TOTAL PROTEIN: 6.6 g/dL (ref 6.0–8.3)
Total Bilirubin: 0.5 mg/dL (ref 0.2–1.2)

## 2014-07-03 LAB — LIPID PANEL
CHOL/HDL RATIO: 3
CHOLESTEROL: 159 mg/dL (ref 0–200)
HDL: 51.4 mg/dL (ref >39.00)
LDL Cholesterol: 68 mg/dL (ref 0–99)
NonHDL: 107.6
Triglycerides: 199 mg/dL — ABNORMAL HIGH (ref 0.0–149.0)
VLDL: 39.8 mg/dL (ref 0.0–40.0)

## 2014-07-03 LAB — VITAMIN D 25 HYDROXY (VIT D DEFICIENCY, FRACTURES): VITD: 40.6 ng/mL (ref 30.00–100.00)

## 2014-07-03 LAB — TSH: TSH: 5.68 u[IU]/mL — ABNORMAL HIGH (ref 0.35–4.50)

## 2014-07-03 MED ORDER — LEVOTHYROXINE SODIUM 50 MCG PO TABS: ORAL_TABLET | ORAL | Status: DC

## 2014-07-03 MED ORDER — ESTRADIOL 1 MG PO TABS: 1.0000 mg | ORAL_TABLET | Freq: Every day | ORAL | Status: DC

## 2014-07-03 MED ORDER — ALPRAZOLAM 0.5 MG PO TABS: 0.5000 mg | ORAL_TABLET | Freq: Two times a day (BID) | ORAL | Status: DC | PRN

## 2014-07-03 MED ORDER — MEDROXYPROGESTERONE ACETATE 2.5 MG PO TABS: 2.5000 mg | ORAL_TABLET | Freq: Every day | ORAL | Status: DC

## 2014-07-03 MED ORDER — HYDROCODONE-HOMATROPINE 5-1.5 MG/5ML PO SYRP: 5.0000 mL | ORAL_SOLUTION | Freq: Three times a day (TID) | ORAL | Status: DC | PRN

## 2014-07-03 MED ORDER — FLUOXETINE HCL 40 MG PO CAPS: 40.0000 mg | ORAL_CAPSULE | Freq: Every day | ORAL | Status: DC

## 2014-07-03 NOTE — Assessment & Plan Note (Signed)
No change in clinical status  tsh with lab today  Pt has Turner's syndrome

## 2014-07-03 NOTE — Assessment & Plan Note (Signed)
Rev dexa from 3/15 No falls or fx Disc need for calcium/ vitamin D/ wt bearing exercise and bone density test every 2 y to monitor Disc safety/ fracture risk in detail    Re check 3/17 Is on HRT and wants to continue until age 59

## 2014-07-03 NOTE — Assessment & Plan Note (Signed)
Pt no longer goes to [facility] for monitoring No problems or issues

## 2014-07-03 NOTE — Patient Instructions (Signed)
Try the hycodan for cough- watch out for sedation  Drink fluids and rest Update if not starting to improve in a week or if worsening    Labs today  Take care of yourself

## 2014-07-03 NOTE — Progress Notes (Signed)
Pre visit review using our clinic review tool, if applicable. No additional management support is needed unless otherwise documented below in the visit note. 

## 2014-07-03 NOTE — Assessment & Plan Note (Signed)
Reviewed health habits including diet and exercise and skin cancer prevention Reviewed appropriate screening tests for age  Also reviewed health mt list, fam hx and immunization status , as well as social and family history   Lab today incl vit D meds refilled Disc risks of HRT in detail (incl breast ca)-pt voiced awarenes-wants to continue until age 59

## 2014-07-03 NOTE — Progress Notes (Signed)
Subjective:    Patient ID: Debra Ford, female    DOB: [birth year], 59 y.o.   MRN: [id number]  HPI Here for health maintenance exam and to review chronic medical problems    Getting over a cold and cough Otherwise doing well   Wt is down 2 lb with bmi of 29   Not interested in HIV screen   Mammogram due 3/15- has her appt already  Flu shot 11/15 Td 2/14 colonosc 4/13 -- 5 year recall - with family history  Pap 2/14  No gyn symptoms  No problems at all  Still on estrace and provera - knows it inc her risk of breast cancer , wants to stay on it until 62  On it for a long time    Osteopenia 3/15 - with lowest T score -1.6  Taking her ca and D No falls or fractures   Folic acid and omega 3 were recommended for her hearing   Has Turner's syndrome-no longer has f/u at [facility] -they moved the office  She never got notice    Patient Active Problem List   Diagnosis Date Noted  . Routine general medical examination at a health care facility 06/25/2014  . Acute suppurative otitis media 04/24/2014  . Family history of colon cancer 04/21/2011  . Osteopenia 12/29/2006  . Hypothyroidism 11/30/2006  . OBSESSIVE-COMPULSIVE DISORDER 11/20/2006  . DECREASED HEARING 11/20/2006  . HEMORRHOIDS, INTERNAL 11/20/2006  . [place] SYNDROME 11/20/2006   Past Medical History  Diagnosis Date  . Hyperthyroidism   . Hearing loss   . OCD (obsessive compulsive disorder)   . Turner's syndrome   . Osteopenia   . Elevated liver function tests    Past Surgical History  Procedure Laterality Date  . Breast enhancement surgery  1988    bilateral  . Maxum  2012    hearing surgery   History  Substance Use Topics  . Smoking status: Never Smoker   . Smokeless tobacco: Never Used  . Alcohol Use: No   Family History  Problem Relation Age of Onset  . Diabetes Mother   . Cancer Mother     colon CA  . Colon cancer Mother 75  . Diabetes Father   . Cancer Father     prostate CA  . Colon  polyps Father 96  . Stomach cancer Neg Hx   . Esophageal cancer Neg Hx   . Rectal cancer Neg Hx    Allergies  Allergen Reactions  . Tetanus Toxoids     Hives (?)   Current Outpatient Prescriptions on File Prior to Visit  Medication Sig Dispense Refill  . ALPRAZolam (XANAX) 0.5 MG tablet Take 1 tablet (0.5 mg total) by mouth 2 (two) times daily as needed for anxiety (as needed for severe anxiety- caution of sedation). 30 tablet 0  . CALCIUM CITRATE-VITAMIN D PO Take 2 tablets by mouth daily.      . cholecalciferol (VITAMIN D) 1000 UNITS tablet Take 1,000 Units by mouth daily.    Marland Kitchen estradiol (ESTRACE) 1 MG tablet Take 1 tablet (1 mg total) by mouth daily. With medroxyprogesterone 90 tablet 3  . FLUoxetine (PROZAC) 40 MG capsule Take 1 capsule (40 mg total) by mouth daily. 90 capsule 3  . HYDROcodone-homatropine (HYCODAN) 5-1.5 MG/5ML syrup Take 5 mLs by mouth every 8 (eight) hours as needed for cough. 120 mL 0  . levocetirizine (XYZAL) 5 MG tablet Take 1 tablet (5 mg total) by mouth every evening. 30 tablet 2  .  levothyroxine (SYNTHROID, LEVOTHROID) 50 MCG tablet TAKE ONE TABLET BY MOUTH ONCE DAILY 90 tablet 3  . medroxyPROGESTERone (PROVERA) 2.5 MG tablet Take 1 tablet (2.5 mg total) by mouth daily. With Estradiol 90 tablet 1  . Multiple Vitamin (MULTIVITAMIN) tablet Take 1 tablet by mouth daily.      . Multiple Vitamins-Minerals (PRESERVISION/LUTEIN PO) Take 1 tablet by mouth daily.      Marland Kitchen terbinafine (LAMISIL) 250 MG tablet Take 1 tablet (250 mg total) by mouth daily. 30 tablet 0   No current facility-administered medications on file prior to visit.     Review of Systems   Review of Systems  Constitutional: Negative for fever, appetite change,  and unexpected weight change.  ENT pos for cong and rhinorrhea and neg for sinus pain  Eyes: Negative for pain and visual disturbance.  Respiratory: Negative for wheeze and shortness of breath.   Cardiovascular: Negative for cp or  palpitations    Gastrointestinal: Negative for nausea, diarrhea and constipation.  Genitourinary: Negative for urgency and frequency.  Skin: Negative for pallor or rash   Neurological: Negative for weakness, light-headedness, numbness and headaches.  Hematological: Negative for adenopathy. Does not bruise/bleed easily.  Psychiatric/Behavioral: Negative for dysphoric mood. The patient is not nervous/anxious.      Objective:   Physical Exam  Constitutional: She appears well-developed and well-nourished. No distress.  overwt and well appearing   HENT:  Head: Normocephalic and atraumatic.  Right Ear: External ear normal.  Left Ear: External ear normal.  Mouth/Throat: Oropharynx is clear and moist.  Nares are injected and congested   Clear rhinorrhea Throat clear    HOH baseline  Eyes: Conjunctivae and EOM are normal. Pupils are equal, round, and reactive to light. Right eye exhibits no discharge. Left eye exhibits no discharge. No scleral icterus.  Neck: Normal range of motion. Neck supple. No JVD present. No thyromegaly present.  Cardiovascular: Normal rate, regular rhythm, normal heart sounds and intact distal pulses.  Exam reveals no gallop.   Pulmonary/Chest: Effort normal and breath sounds normal. No respiratory distress. She has no wheezes. She has no rales.  Abdominal: Soft. Bowel sounds are normal. She exhibits no distension and no mass. There is no tenderness.  Genitourinary:  Breast exam: No mass, nodules, thickening, tenderness, bulging, retraction, inflamation, nipple discharge or skin changes noted.  No axillary or clavicular LA.      Musculoskeletal: She exhibits no edema or tenderness.  Lymphadenopathy:    She has no cervical adenopathy.  Neurological: She is alert. She has normal reflexes. No cranial nerve deficit. She exhibits normal muscle tone. Coordination normal.  Skin: Skin is warm and dry. No rash noted. No erythema. No pallor.  Psychiatric: She has a normal  mood and affect.          Assessment & Plan:   Problem List Items Addressed This Visit      Respiratory   Viral URI with cough    Mild with cough Px hycodan with warning of sedation  Update if not starting to improve in a week or if worsening   Adv fluids and rest         Endocrine   Hypothyroidism - Primary    No change in clinical status  tsh with lab today  Pt has Turner's syndrome         Relevant Medications   levothyroxine (SYNTHROID, LEVOTHROID) tablet     Musculoskeletal and Integument   Osteopenia    Rev dexa from 3/15  No falls or fx Disc need for calcium/ vitamin D/ wt bearing exercise and bone density test every 2 y to monitor Disc safety/ fracture risk in detail    Re check 3/17 Is on HRT and wants to continue until age 37          Genitourinary   25 SYNDROME    Pt no longer goes to [facility] for monitoring No problems or issues           Other   Routine general medical examination at a health care facility    Reviewed health habits including diet and exercise and skin cancer prevention Reviewed appropriate screening tests for age  Also reviewed health mt list, fam hx and immunization status , as well as social and family history   Lab today incl vit D meds refilled Disc risks of HRT in detail (incl breast ca)-pt voiced awarenes-wants to continue until age 59

## 2014-07-03 NOTE — Assessment & Plan Note (Signed)
Mild with cough Px hycodan with warning of sedation  Update if not starting to improve in a week or if worsening   Adv fluids and rest

## 2014-07-11 ENCOUNTER — Encounter: Payer: Self-pay | Admitting: Family Medicine

## 2014-07-11 ENCOUNTER — Telehealth: Payer: Self-pay | Admitting: Family Medicine

## 2014-07-11 MED ORDER — LEVOTHYROXINE SODIUM 75 MCG PO TABS: 75.0000 ug | ORAL_TABLET | Freq: Every day | ORAL | Status: DC

## 2014-07-11 NOTE — Telephone Encounter (Signed)
Increase thyroid dose  Will refill electronically - 75 mcg  Pt will call for lab appt 6 wk  See mychart note

## 2014-07-19 ENCOUNTER — Encounter: Payer: Self-pay | Admitting: Family Medicine

## 2014-08-22 ENCOUNTER — Telehealth: Payer: Self-pay | Admitting: Family Medicine

## 2014-08-22 DIAGNOSIS — E038 Other specified hypothyroidism: Secondary | ICD-10-CM

## 2014-08-22 NOTE — Telephone Encounter (Signed)
-----   Message from Ellamae Sia sent at 08/18/2014 11:07 AM EDT ----- Regarding: Lab orders for Wednesday,4.13.16  Orders for 6 week labs

## 2014-08-23 ENCOUNTER — Other Ambulatory Visit (INDEPENDENT_AMBULATORY_CARE_PROVIDER_SITE_OTHER): Payer: Self-pay

## 2014-08-23 DIAGNOSIS — E038 Other specified hypothyroidism: Secondary | ICD-10-CM

## 2014-08-23 LAB — TSH: TSH: 2.9 u[IU]/mL (ref 0.35–4.50)

## 2014-09-01 NOTE — Op Note (Signed)
PATIENT NAME:  Debra Ford, ECHEVERRY MR#:  [id number] DATE OF BIRTH:  22-Sep-1955  DATE OF PROCEDURE:  08/27/2012  PREOPERATIVE DIAGNOSIS:  Postmenopausal bleeding.   POSTOPERATIVE DIAGNOSIS:  Postmenopausal bleeding.   PROCEDURE: Dilation and curettage.   SURGEON: Glean Salen, M.D.   ANESTHESIA: General.   ESTIMATED BLOOD LOSS: Minimal.   COMPLICATIONS: None.   FINDINGS: Intrauterine mass.   DISPOSITION: To recovery room stable.   TECHNIQUE: The patient is prepped and draped in the usual sterile fashion after adequate anesthesia is obtained, in the dorsal lithotomy position. Bladder is drained with a Robinson catheter. Speculum is placed and an intrauterine mass is seen and palpated to extrude through the cervix into the vagina partially. This is grasped with a ring forceps and with some mild to moderate amount of twisting and tension it is removed. It is sent to pathology for further review. The cervix is grasped with a tenaculum and the cervix is already dilated and fits a 20 Pratt dilator. The uterus is sounded to 9 cm and a gentle curettage with a banjo curette is performed for additional tissue to be sent to pathology for further review as endometrial curettage. Excellent         hemostasis is noted. Tenaculum is removed and there is no bleeding at that site either. The patient goes to the recovery room in stable condition. All sponge, instrument and needle counts are correct.  ____________________________ R. Barnett Applebaum, MD rph:sb D: 08/27/2012 10:39:09 ET T: 08/27/2012 11:06:49 ET JOB#: [id number]  cc: Glean Salen, MD, <Dictator> Gae Dry MD ELECTRONICALLY SIGNED 08/28/2012 7:33

## 2014-11-09 ENCOUNTER — Other Ambulatory Visit: Payer: Self-pay | Admitting: Family Medicine

## 2014-11-28 ENCOUNTER — Other Ambulatory Visit: Payer: Self-pay | Admitting: Otolaryngology

## 2014-11-28 ENCOUNTER — Ambulatory Visit
Admission: RE | Admit: 2014-11-28 | Discharge: 2014-11-28 | Disposition: A | Discharge status: Home / Self Care | Payer: No Typology Code available for payment source | Source: Ambulatory Visit | Attending: Otolaryngology | Admitting: Otolaryngology

## 2014-11-28 DIAGNOSIS — H9121 Sudden idiopathic hearing loss, right ear: Secondary | ICD-10-CM

## 2014-11-28 MED ORDER — IOPAMIDOL (ISOVUE-300) INJECTION 61%
75.0000 mL | Freq: Once | INTRAVENOUS | Status: AC | PRN
  Administered 2014-11-28: 75 mL via INTRAVENOUS

## 2014-12-02 ENCOUNTER — Other Ambulatory Visit: Payer: Self-pay | Admitting: Family Medicine

## 2015-03-02 ENCOUNTER — Other Ambulatory Visit: Payer: Self-pay | Admitting: Family Medicine

## 2015-03-05 ENCOUNTER — Other Ambulatory Visit: Payer: Self-pay | Admitting: Family Medicine

## 2015-03-06 MED ORDER — MEDROXYPROGESTERONE ACETATE 2.5 MG PO TABS: ORAL_TABLET | ORAL | Status: DC

## 2015-03-06 NOTE — Addendum Note (Signed)
Addended by: Tammi Sou on: 03/06/2015 04:28 PM   Modules accepted: Orders

## 2015-05-04 ENCOUNTER — Other Ambulatory Visit: Payer: Self-pay | Admitting: Family Medicine

## 2015-06-28 ENCOUNTER — Telehealth: Payer: Self-pay | Admitting: Family Medicine

## 2015-06-28 DIAGNOSIS — Z0000 Encounter for general adult medical examination without abnormal findings: Secondary | ICD-10-CM

## 2015-06-28 NOTE — Telephone Encounter (Signed)
-----   Message from Ellamae Sia sent at 06/27/2015  6:07 PM EST ----- Regarding: Lab orders for Friday, 12.17.17 Patient is scheduled for CPX labs, please order future labs, Thanks , Karna Christmas

## 2015-06-29 ENCOUNTER — Other Ambulatory Visit (INDEPENDENT_AMBULATORY_CARE_PROVIDER_SITE_OTHER): Payer: Self-pay

## 2015-06-29 DIAGNOSIS — Z0000 Encounter for general adult medical examination without abnormal findings: Secondary | ICD-10-CM

## 2015-06-29 LAB — COMPREHENSIVE METABOLIC PANEL
ALK PHOS: 118 U/L — AB (ref 39–117)
ALT: 30 U/L (ref 0–35)
AST: 21 U/L (ref 0–37)
Albumin: 4.2 g/dL (ref 3.5–5.2)
BILIRUBIN TOTAL: 0.8 mg/dL (ref 0.2–1.2)
BUN: 12 mg/dL (ref 6–23)
CALCIUM: 9.2 mg/dL (ref 8.4–10.5)
CO2: 30 mEq/L (ref 19–32)
CREATININE: 0.73 mg/dL (ref 0.40–1.20)
Chloride: 102 mEq/L (ref 96–112)
GFR: 87.17 mL/min (ref >60.00)
Glucose, Bld: 98 mg/dL (ref 70–99)
Potassium: 4.1 mEq/L (ref 3.5–5.1)
Sodium: 139 mEq/L (ref 135–145)
TOTAL PROTEIN: 6.4 g/dL (ref 6.0–8.3)

## 2015-06-29 LAB — CBC WITH DIFFERENTIAL/PLATELET
BASOS ABS: 0 10*3/uL (ref 0.0–0.1)
Basophils Relative: 0.4 % (ref 0.0–3.0)
EOS ABS: 0.1 10*3/uL (ref 0.0–0.7)
Eosinophils Relative: 1.1 % (ref 0.0–5.0)
HEMATOCRIT: 39 % (ref 36.0–46.0)
Hemoglobin: 13.3 g/dL (ref 12.0–15.0)
LYMPHS ABS: 1.9 10*3/uL (ref 0.7–4.0)
LYMPHS PCT: 21 % (ref 12.0–46.0)
MCHC: 34 g/dL (ref 30.0–36.0)
MCV: 93.8 fl (ref 78.0–100.0)
MONOS PCT: 8.9 % (ref 3.0–12.0)
Monocytes Absolute: 0.8 10*3/uL (ref 0.1–1.0)
NEUTROS PCT: 68.6 % (ref 43.0–77.0)
Neutro Abs: 6.4 10*3/uL (ref 1.4–7.7)
Platelets: 215 10*3/uL (ref 150.0–400.0)
RBC: 4.16 Mil/uL (ref 3.87–5.11)
RDW: 13 % (ref 11.5–15.5)
WBC: 9.3 10*3/uL (ref 4.0–10.5)

## 2015-06-29 LAB — LIPID PANEL
CHOL/HDL RATIO: 3
CHOLESTEROL: 162 mg/dL (ref 0–200)
HDL: 60.7 mg/dL (ref >39.00)
LDL Cholesterol: 86 mg/dL (ref 0–99)
NonHDL: 101.37
TRIGLYCERIDES: 76 mg/dL (ref 0.0–149.0)
VLDL: 15.2 mg/dL (ref 0.0–40.0)

## 2015-06-29 LAB — TSH: TSH: 2.44 u[IU]/mL (ref 0.35–4.50)

## 2015-07-04 ENCOUNTER — Other Ambulatory Visit (HOSPITAL_COMMUNITY)
Admission: RE | Admit: 2015-07-04 | Discharge: 2015-07-04 | Disposition: A | Discharge status: Home / Self Care | Payer: Self-pay | Source: Ambulatory Visit | Attending: Family Medicine | Admitting: Family Medicine

## 2015-07-04 ENCOUNTER — Telehealth: Payer: Self-pay | Admitting: Family Medicine

## 2015-07-04 ENCOUNTER — Encounter: Payer: Self-pay | Admitting: Family Medicine

## 2015-07-04 ENCOUNTER — Ambulatory Visit (INDEPENDENT_AMBULATORY_CARE_PROVIDER_SITE_OTHER): Payer: Self-pay | Admitting: Family Medicine

## 2015-07-04 VITALS — BP 120/70 | HR 88 | Temp 98.3°F | Ht 60.0 in | Wt 154.8 lb

## 2015-07-04 DIAGNOSIS — Q969 Turner's syndrome, unspecified: Secondary | ICD-10-CM

## 2015-07-04 DIAGNOSIS — E038 Other specified hypothyroidism: Secondary | ICD-10-CM

## 2015-07-04 DIAGNOSIS — M8580 Other specified disorders of bone density and structure, unspecified site: Secondary | ICD-10-CM

## 2015-07-04 DIAGNOSIS — Z01419 Encounter for gynecological examination (general) (routine) without abnormal findings: Secondary | ICD-10-CM

## 2015-07-04 DIAGNOSIS — E2839 Other primary ovarian failure: Secondary | ICD-10-CM

## 2015-07-04 DIAGNOSIS — Z0000 Encounter for general adult medical examination without abnormal findings: Secondary | ICD-10-CM

## 2015-07-04 DIAGNOSIS — Z1151 Encounter for screening for human papillomavirus (HPV): Secondary | ICD-10-CM | POA: Insufficient documentation

## 2015-07-04 MED ORDER — MEDROXYPROGESTERONE ACETATE 2.5 MG PO TABS: ORAL_TABLET | ORAL | Status: DC

## 2015-07-04 MED ORDER — CICLOPIROX 8 % EX SOLN: Freq: Every day | CUTANEOUS | Status: DC

## 2015-07-04 MED ORDER — LEVOTHYROXINE SODIUM 75 MCG PO TABS: 75.0000 ug | ORAL_TABLET | Freq: Every day | ORAL | Status: DC

## 2015-07-04 MED ORDER — ESTRADIOL 1 MG PO TABS: 1.0000 mg | ORAL_TABLET | Freq: Every day | ORAL | Status: DC

## 2015-07-04 NOTE — Telephone Encounter (Signed)
Please let pt know that it looks like cardiac imaging is recommended every 5-10 years for pt with her condition  Looks like she had a cardiac MRI in 2007 Does she know if she has had an echocardiogram since then? I cannot find it anywhere from [place] or externally   Also - I forgot to order her dexa when she was here - I just ordered it so she should get a call  Thanks

## 2015-07-04 NOTE — Progress Notes (Signed)
Subjective:    Patient ID: Debra Ford, female    DOB: 12-01-1955, 60 y.o.   MRN: [id number]  HPI Here for health maintenance exam and to review chronic medical problems    Wt is up 3 lb with bmi of 30  Screen for Hep C/HIV-not interested in screen-not high risk   Pap 2/14 neg with neg HPV endom polyp 2014 Her gyn moved  No bleeding since removal of poyps  Takes 2.5 mg of prog today, with her est -no changes   Mm 3/16 neg- she has appt on 3/6  Self breast exam Has breast implants -since [id number]- silicone She worries about rupture-we reviewed her mammogram  No symptoms   Flu shot 10/16  colonosc 4/13 polyps- 5 y recall/also fam hx mother  Td 2/14  dexa 3/15- osteopenia slt improved  No falls or broken bones  Takes ca and D  Due for 2 y dexa -last one was at [facility]    In July- hearing dec in R ear - even with aide -had some steroid inj Going for further testing  May need to do something further   Hypothyroid Lab Results  Component Value Date   TSH 2.44 06/29/2015    Cholesterol Lab Results  Component Value Date   CHOL 162 06/29/2015   CHOL 159 07/03/2014   CHOL 166 06/27/2013   Lab Results  Component Value Date   HDL 60.70 06/29/2015   HDL 51.40 07/03/2014   HDL 61.10 06/27/2013   Lab Results  Component Value Date   LDLCALC 86 06/29/2015   LDLCALC 68 07/03/2014   LDLCALC 76 06/27/2013   Lab Results  Component Value Date   TRIG 76.0 06/29/2015   TRIG 199.0* 07/03/2014   TRIG 145.0 06/27/2013   Lab Results  Component Value Date   CHOLHDL 3 06/29/2015   CHOLHDL 3 07/03/2014   CHOLHDL 3 06/27/2013   No results found for: LDLDIRECT  Diet is fairly good - occ fast food/not a lot  Some exercise -going back to [employer]   Wt is up 3 lb with bmi of 30     Chemistry      Component Value Date/Time   NA 139 06/29/2015 0855   NA 141 08/23/2012 0218   K 4.1 06/29/2015 0855   K 4.2 08/23/2012 0218   CL 102 06/29/2015 0855   CL 107  08/23/2012 0218   CO2 30 06/29/2015 0855   CO2 29 08/23/2012 0218   BUN 12 06/29/2015 0855   BUN 11 08/23/2012 0218   CREATININE 0.73 06/29/2015 0855   CREATININE 0.62 08/23/2012 0218      Component Value Date/Time   CALCIUM 9.2 06/29/2015 0855   CALCIUM 8.8 08/23/2012 0218   ALKPHOS 118* 06/29/2015 0855   AST 21 06/29/2015 0855   ALT 30 06/29/2015 0855   BILITOT 0.8 06/29/2015 0855      Lab Results  Component Value Date   WBC 9.3 06/29/2015   HGB 13.3 06/29/2015   HCT 39.0 06/29/2015   MCV 93.8 06/29/2015   PLT 215.0 06/29/2015     Had toenail fungus treated in her nails  Came back in the big nail  Interested in topical tx like penlac   Patient Active Problem List   Diagnosis Date Noted  . Encounter for routine gynecological examination 07/04/2015  . Estrogen deficiency 07/04/2015  . Routine general medical examination at a health care facility 06/25/2014  . Acute suppurative otitis media 04/24/2014  . Family  history of colon cancer 04/21/2011  . Osteopenia 12/29/2006  . Hypothyroidism 11/30/2006  . OBSESSIVE-COMPULSIVE DISORDER 11/20/2006  . DECREASED HEARING 11/20/2006  . HEMORRHOIDS, INTERNAL 11/20/2006  . [place] SYNDROME 11/20/2006   Past Medical History  Diagnosis Date  . Hyperthyroidism   . Hearing loss   . OCD (obsessive compulsive disorder)   . Turner's syndrome   . Osteopenia   . Elevated liver function tests    Past Surgical History  Procedure Laterality Date  . Breast enhancement surgery  1988    bilateral  . Maxum  2012    hearing surgery   Social History  Substance Use Topics  . Smoking status: Never Smoker   . Smokeless tobacco: Never Used  . Alcohol Use: No   Family History  Problem Relation Age of Onset  . Diabetes Mother   . Cancer Mother     colon CA  . Colon cancer Mother 73  . Diabetes Father   . Cancer Father     prostate CA  . Colon polyps Father 27  . Stomach cancer Neg Hx   . Esophageal cancer Neg Hx   . Rectal  cancer Neg Hx    Allergies  Allergen Reactions  . Tetanus Toxoids     Hives (?)   Current Outpatient Prescriptions on File Prior to Visit  Medication Sig Dispense Refill  . ALPRAZolam (XANAX) 0.5 MG tablet Take 1 tablet (0.5 mg total) by mouth 2 (two) times daily as needed for anxiety (as needed for severe anxiety- caution of sedation). 30 tablet 0  . CALCIUM CITRATE-VITAMIN D PO Take 2 tablets by mouth daily.      . cholecalciferol (VITAMIN D) 1000 UNITS tablet Take 1,000 Units by mouth daily.    Marland Kitchen FLUoxetine (PROZAC) 40 MG capsule Take 1 capsule (40 mg total) by mouth daily. 90 capsule 3  . Multiple Vitamin (MULTIVITAMIN) tablet Take 1 tablet by mouth daily.      . Multiple Vitamins-Minerals (PRESERVISION/LUTEIN PO) Take 1 tablet by mouth daily.      Marland Kitchen terbinafine (LAMISIL) 250 MG tablet Take 1 tablet (250 mg total) by mouth daily. 30 tablet 0   No current facility-administered medications on file prior to visit.    Review of Systems Review of Systems  Constitutional: Negative for fever, appetite change, fatigue and unexpected weight change.  Eyes: Negative for pain and visual disturbance.  Respiratory: Negative for cough and shortness of breath.   Cardiovascular: Negative for cp or palpitations    Gastrointestinal: Negative for nausea, diarrhea and constipation.  Genitourinary: Negative for urgency and frequency.  Skin: Negative for pallor or rash  pos for fungal toenail R great toe  Neurological: Negative for weakness, light-headedness, numbness and headaches.  Hematological: Negative for adenopathy. Does not bruise/bleed easily.  Psychiatric/Behavioral: Negative for dysphoric mood. The patient is not nervous/anxious.         Objective:   Physical Exam  Constitutional: She appears well-developed and well-nourished. No distress.  overwt and well appearing   HENT:  Head: Normocephalic and atraumatic.  Right Ear: External ear normal.  Left Ear: External ear normal.  Nose:  Nose normal.  Mouth/Throat: Oropharynx is clear and moist.  Poor hearing baseline with hearing aides   Eyes: Conjunctivae and EOM are normal. Pupils are equal, round, and reactive to light. Right eye exhibits no discharge. Left eye exhibits no discharge. No scleral icterus.  Neck: Normal range of motion. Neck supple. No JVD present. Carotid bruit is not present. No  thyromegaly present.  Cardiovascular: Normal rate, regular rhythm, normal heart sounds and intact distal pulses.  Exam reveals no gallop.   Pulmonary/Chest: Effort normal and breath sounds normal. No respiratory distress. She has no wheezes. She has no rales.  Abdominal: Soft. Bowel sounds are normal. She exhibits no distension and no mass. There is no tenderness.  Genitourinary: Vagina normal and uterus normal. No breast swelling, tenderness, discharge or bleeding. There is no rash, tenderness or lesion on the right labia. There is no rash, tenderness or lesion on the left labia. Uterus is not enlarged and not tender. Cervix exhibits no motion tenderness, no discharge and no friability. Right adnexum displays no mass, no tenderness and no fullness. Left adnexum displays no mass, no tenderness and no fullness. No bleeding in the vagina. No vaginal discharge found.  Breast exam: No mass, nodules, thickening, tenderness, bulging, retraction, inflamation, nipple discharge or skin changes noted.  No axillary or clavicular LA.    Pt has breast implants (silicone) and R breast is firmer than the L   Musculoskeletal: She exhibits no edema or tenderness.  Lymphadenopathy:    She has no cervical adenopathy.  Neurological: She is alert. She has normal reflexes. No cranial nerve deficit. She exhibits normal muscle tone. Coordination normal.  Skin: Skin is warm and dry. No rash noted. No erythema. No pallor.  Psychiatric: She has a normal mood and affect.          Assessment & Plan:   Problem List Items Addressed This Visit      Endocrine    Hypothyroidism    Hypothyroidism  Pt has no clinical changes No change in energy level/ hair or skin/ edema and no tremor Lab Results  Component Value Date   TSH 2.44 06/29/2015          Relevant Medications   levothyroxine (SYNTHROID, LEVOTHROID) 75 MCG tablet     Musculoskeletal and Integument   Osteopenia    Disc need for calcium/ vitamin D/ wt bearing exercise and bone density test every 2 y to monitor Disc safety/ fracture risk in detail   dexa planned - 2 year f/u      Relevant Orders   DG Bone Density     Genitourinary   TURNER'S SYNDROME    Will look into recommendations re: frequency of cardiac/aorta imaging  dexa planned  Continue thyroid tx         Other   Encounter for routine gynecological examination - Primary    3 y exam  No post men bleeding Exam and pap done  Pt chooses to stay on low dose est and prog - until age 39 for menopausal symptoms  Pros/cons/risks reviewed incl risk of breast cancer and pt voiced understanding       Relevant Orders   Cytology - PAP   Estrogen deficiency   Relevant Orders   DG Bone Density   Routine general medical examination at a health care facility    Reviewed health habits including diet and exercise and skin cancer prevention Reviewed appropriate screening tests for age  Also reviewed health mt list, fam hx and immunization status , as well as social and family history   See HPI Labs rev Take care of yourself  Eat healthy Exercise  Labs are stable  I will look into the recommendation for echocardiogram and get back to you Try the penlac  Pap done today  We will schedule dexa  Get your mammogram as planned

## 2015-07-04 NOTE — Progress Notes (Signed)
Pre visit review using our clinic review tool, if applicable. No additional management support is needed unless otherwise documented below in the visit note. 

## 2015-07-04 NOTE — Patient Instructions (Signed)
Take care of yourself  Eat healthy Exercise  Labs are stable  I will look into the recommendation for echocardiogram and get back to you Try the penlac  Pap done today

## 2015-07-05 NOTE — Assessment & Plan Note (Signed)
Will look into recommendations re: frequency of cardiac/aorta imaging  dexa planned  Continue thyroid tx

## 2015-07-05 NOTE — Assessment & Plan Note (Signed)
Disc need for calcium/ vitamin D/ wt bearing exercise and bone density test every 2 y to monitor Disc safety/ fracture risk in detail   dexa planned - 2 year f/u

## 2015-07-05 NOTE — Assessment & Plan Note (Signed)
Reviewed health habits including diet and exercise and skin cancer prevention Reviewed appropriate screening tests for age  Also reviewed health mt list, fam hx and immunization status , as well as social and family history   See HPI Labs rev Take care of yourself  Eat healthy Exercise  Labs are stable  I will look into the recommendation for echocardiogram and get back to you Try the penlac  Pap done today  We will schedule dexa  Get your mammogram as planned

## 2015-07-05 NOTE — Assessment & Plan Note (Signed)
3 y exam  No post men bleeding Exam and pap done  Pt chooses to stay on low dose est and prog - until age 60 for menopausal symptoms  Pros/cons/risks reviewed incl risk of breast cancer and pt voiced understanding

## 2015-07-05 NOTE — Assessment & Plan Note (Signed)
Hypothyroidism  Pt has no clinical changes No change in energy level/ hair or skin/ edema and no tremor Lab Results  Component Value Date   TSH 2.44 06/29/2015

## 2015-07-06 LAB — CYTOLOGY - PAP

## 2015-07-06 NOTE — Telephone Encounter (Signed)
Pt came in  She stated she has a hard time hearing on phone She stated she has not had a echocradiogram in 2007 She stated the last time she went to [facility] but she doesn't mind going to [place] or [place] She said she could go any time just to make appointment and mail her the appointment date and time

## 2015-07-06 NOTE — Telephone Encounter (Signed)
Called pt and no answer and voicemail box wasn't set up

## 2015-07-08 NOTE — Telephone Encounter (Signed)
I will refer for echo  Will route to [facility]

## 2015-07-09 NOTE — Addendum Note (Signed)
Addended by: Loura Pardon A on: 07/09/2015 11:20 AM   Modules accepted: Orders

## 2015-07-13 ENCOUNTER — Encounter: Payer: Self-pay | Admitting: Family Medicine

## 2015-07-13 NOTE — Telephone Encounter (Signed)
Called patient at both phone numbers and you cant leave any messages on either phone number. Will continue to try to call the patient but might have to send a letter.

## 2015-07-13 NOTE — Telephone Encounter (Signed)
Pt is scheduled at [facility] 08/14/15 @ 1pm  A letter was mailed to the pt with all information needed.

## 2015-07-13 NOTE — Telephone Encounter (Signed)
Thanks- she is quite hard of hearing as well  A letter would be a good idea I appreciate the heads up  It is not an urgent order

## 2015-07-16 ENCOUNTER — Encounter: Payer: Self-pay | Admitting: Family Medicine

## 2015-07-16 LAB — HM DEXA SCAN

## 2015-07-24 ENCOUNTER — Encounter: Payer: Self-pay | Admitting: *Deleted

## 2015-08-06 ENCOUNTER — Encounter: Payer: Self-pay | Admitting: Family Medicine

## 2015-08-06 ENCOUNTER — Ambulatory Visit (INDEPENDENT_AMBULATORY_CARE_PROVIDER_SITE_OTHER): Payer: Self-pay | Admitting: Family Medicine

## 2015-08-06 VITALS — BP 124/56 | HR 91 | Temp 98.2°F | Ht 60.0 in | Wt 154.2 lb

## 2015-08-06 DIAGNOSIS — T8543XA Leakage of breast prosthesis and implant, initial encounter: Secondary | ICD-10-CM

## 2015-08-06 DIAGNOSIS — J069 Acute upper respiratory infection, unspecified: Secondary | ICD-10-CM | POA: Insufficient documentation

## 2015-08-06 NOTE — Assessment & Plan Note (Signed)
In pt with hx of ETD/hearing loss and frequent OM  Reassuring exam today Disc symptomatic care - see instructions on AVS  Can try flonase for ETD- this may improve symptoms and poss prevent OM  Update if not starting to improve in a week or if worsening

## 2015-08-06 NOTE — Progress Notes (Signed)
Subjective:    Patient ID: Debra Ford, female    DOB: February 24, 1956, 60 y.o.   MRN: [id number]  HPI Here with concerns about her breast implants and also has a cold  Ruptures were present on her mammogram - has silicone breast immplants   She cannot tell they are ruptured  No symptoms at all  There is leakage seen one breast superior and one inferior No trauma that she remembers   No discomfort or chest wall pain   These were done in [facility] at [facility]  She is interested in seeing a plastic surgeon to get an opinion  Does not think her last surgeon is still around   [place] a referral   Has a head cold about a week Worries about ear infection (is prone to them) Has not had a fever  Taking nyquil Mild cough-not productive   L ear rings loudly  She has no pain    Patient Active Problem List   Diagnosis Date Noted  . Breast implant leak 08/06/2015  . Viral URI 08/06/2015  . Encounter for routine gynecological examination 07/04/2015  . Estrogen deficiency 07/04/2015  . Routine general medical examination at a health care facility 06/25/2014  . Acute suppurative otitis media 04/24/2014  . Family history of colon cancer 04/21/2011  . Osteopenia 12/29/2006  . Hypothyroidism 11/30/2006  . OBSESSIVE-COMPULSIVE DISORDER 11/20/2006  . DECREASED HEARING 11/20/2006  . HEMORRHOIDS, INTERNAL 11/20/2006  . [place] SYNDROME 11/20/2006   Past Medical History  Diagnosis Date  . Hyperthyroidism   . Hearing loss   . OCD (obsessive compulsive disorder)   . Turner's syndrome   . Osteopenia   . Elevated liver function tests    Past Surgical History  Procedure Laterality Date  . Breast enhancement surgery  1988    bilateral  . Maxum  2012    hearing surgery   Social History  Substance Use Topics  . Smoking status: Never Smoker   . Smokeless tobacco: Never Used  . Alcohol Use: No   Family History  Problem Relation Age of Onset  . Diabetes Mother   . Cancer Mother       colon CA  . Colon cancer Mother 22  . Diabetes Father   . Cancer Father     prostate CA  . Colon polyps Father 3  . Stomach cancer Neg Hx   . Esophageal cancer Neg Hx   . Rectal cancer Neg Hx    Allergies  Allergen Reactions  . Tetanus Toxoids     Hives (?)   Current Outpatient Prescriptions on File Prior to Visit  Medication Sig Dispense Refill  . ALPRAZolam (XANAX) 0.5 MG tablet Take 1 tablet (0.5 mg total) by mouth 2 (two) times daily as needed for anxiety (as needed for severe anxiety- caution of sedation). 30 tablet 0  . CALCIUM CITRATE-VITAMIN D PO Take 2 tablets by mouth daily.      . cholecalciferol (VITAMIN D) 1000 UNITS tablet Take 1,000 Units by mouth daily.    . ciclopirox (PENLAC) 8 % solution Apply topically at bedtime. Apply over nail and surrounding skin. Apply daily over previous coat. After seven (7) days, may remove with alcohol and continue cycle. 3 Bottle 1  . estradiol (ESTRACE) 1 MG tablet Take 1 tablet (1 mg total) by mouth daily. With medroxyprogesterone 90 tablet 3  . FLUoxetine (PROZAC) 40 MG capsule Take 1 capsule (40 mg total) by mouth daily. 90 capsule 3  . levothyroxine (  SYNTHROID, LEVOTHROID) 75 MCG tablet Take 1 tablet (75 mcg total) by mouth daily. 90 tablet 3  . medroxyPROGESTERone (PROVERA) 2.5 MG tablet TAKE ONE TABLET (5MG ) BY MOUTH ONCE DAILY WITH ESTRADIOL 90 tablet 3  . Multiple Vitamin (MULTIVITAMIN) tablet Take 1 tablet by mouth daily.      . Multiple Vitamins-Minerals (PRESERVISION/LUTEIN PO) Take 1 tablet by mouth daily.       No current facility-administered medications on file prior to visit.    Review of Systems  Constitutional: Positive for appetite change and fatigue. Negative for fever.  HENT: Positive for congestion, postnasal drip, rhinorrhea, sinus pressure, sneezing and sore throat. Negative for ear pain.   Eyes: Negative for pain and discharge.  Respiratory: Positive for cough. Negative for shortness of breath, wheezing  and stridor.   Cardiovascular: Negative for chest pain.  Gastrointestinal: Negative for nausea, vomiting and diarrhea.  Genitourinary: Negative for urgency, frequency and hematuria.  Musculoskeletal: Negative for myalgias and arthralgias.  Skin: Negative for rash.  Neurological: Positive for headaches. Negative for dizziness, weakness and light-headedness.  Psychiatric/Behavioral: Negative for confusion and dysphoric mood.   Review of Systems         Objective:   Physical Exam  Constitutional: She appears well-developed and well-nourished. No distress.  overwt and well app  HENT:  Head: Normocephalic and atraumatic.  Right Ear: External ear normal.  Left Ear: External ear normal.  Mouth/Throat: Oropharynx is clear and moist.  Nares are injected and congested  No sinus tenderness Clear rhinorrhea and post nasal drip   TMs are dull but clear Baseline hearing loss   Eyes: Conjunctivae and EOM are normal. Pupils are equal, round, and reactive to light. Right eye exhibits no discharge. Left eye exhibits no discharge.  Neck: Normal range of motion. Neck supple.  Cardiovascular: Normal rate and normal heart sounds.   Pulmonary/Chest: Effort normal and breath sounds normal. No respiratory distress. She has no wheezes. She has no rales. She exhibits no tenderness.  No rales or rhonchi  Abdominal: There is no tenderness.  Genitourinary:  Breast exam was done at last visit   Lymphadenopathy:    She has no cervical adenopathy.  Neurological: She is alert.  Skin: Skin is warm and dry. No rash noted.  Psychiatric: She has a normal mood and affect.          Assessment & Plan:   Problem List Items Addressed This Visit      Respiratory   Viral URI    In pt with hx of ETD/hearing loss and frequent OM  Reassuring exam today Disc symptomatic care - see instructions on AVS  Can try flonase for ETD- this may improve symptoms and poss prevent OM  Update if not starting to improve  in a week or if worsening          Other   Breast implant leak - Primary    bilat small leaks of silicone implants seen on mammogram  Recent exam here -no change  ? If scar tissue has walled this off  Interested in risks vs removal - will refer to plastic surgery       Relevant Orders   Ambulatory referral to Plastic Surgery

## 2015-08-06 NOTE — Progress Notes (Signed)
Pre visit review using our clinic review tool, if applicable. No additional management support is needed unless otherwise documented below in the visit note. 

## 2015-08-06 NOTE — Patient Instructions (Signed)
Drink lots of fluids and rest Try flonase nasal spray over the counter for about 2 weeks (daily)-this may prevent an ear infection by keep ear tubes open But if you do get ear pain let me know  Update if not starting to improve in a week or if worsening    Stop at check out for ref to plastic surgeon

## 2015-08-06 NOTE — Assessment & Plan Note (Signed)
bilat small leaks of silicone implants seen on mammogram  Recent exam here -no change  ? If scar tissue has walled this off  Interested in risks vs removal - will refer to plastic surgery

## 2015-08-14 ENCOUNTER — Other Ambulatory Visit: Payer: Self-pay

## 2015-08-14 ENCOUNTER — Ambulatory Visit (INDEPENDENT_AMBULATORY_CARE_PROVIDER_SITE_OTHER): Payer: Self-pay

## 2015-08-14 DIAGNOSIS — Q969 Turner's syndrome, unspecified: Secondary | ICD-10-CM

## 2015-08-20 ENCOUNTER — Other Ambulatory Visit: Payer: Self-pay | Admitting: Family Medicine

## 2016-02-22 ENCOUNTER — Encounter (INDEPENDENT_AMBULATORY_CARE_PROVIDER_SITE_OTHER): Payer: Self-pay | Admitting: Ophthalmology

## 2016-02-22 DIAGNOSIS — H4311 Vitreous hemorrhage, right eye: Secondary | ICD-10-CM

## 2016-02-22 DIAGNOSIS — H43813 Vitreous degeneration, bilateral: Secondary | ICD-10-CM

## 2016-02-22 DIAGNOSIS — H33303 Unspecified retinal break, bilateral: Secondary | ICD-10-CM

## 2016-02-25 ENCOUNTER — Encounter (INDEPENDENT_AMBULATORY_CARE_PROVIDER_SITE_OTHER): Payer: Self-pay | Admitting: Ophthalmology

## 2016-02-25 DIAGNOSIS — H33302 Unspecified retinal break, left eye: Secondary | ICD-10-CM

## 2016-02-29 ENCOUNTER — Other Ambulatory Visit: Payer: Self-pay | Admitting: Family Medicine

## 2016-02-29 NOTE — Telephone Encounter (Signed)
done

## 2016-02-29 NOTE — Telephone Encounter (Signed)
Rx was filled on 07/04/15 #90 with 3 additional refills but this refill request is asking for #180 tabs with new directions of taking 2 tablets daily instead of once tab daily, please advise

## 2016-02-29 NOTE — Telephone Encounter (Signed)
180 is fine Please refill for 6 mo

## 2016-03-03 ENCOUNTER — Encounter (INDEPENDENT_AMBULATORY_CARE_PROVIDER_SITE_OTHER): Payer: Self-pay | Admitting: Ophthalmology

## 2016-03-03 DIAGNOSIS — H33301 Unspecified retinal break, right eye: Secondary | ICD-10-CM

## 2016-03-03 DIAGNOSIS — H4311 Vitreous hemorrhage, right eye: Secondary | ICD-10-CM

## 2016-03-06 ENCOUNTER — Ambulatory Visit (INDEPENDENT_AMBULATORY_CARE_PROVIDER_SITE_OTHER): Payer: Self-pay | Admitting: Ophthalmology

## 2016-03-13 ENCOUNTER — Encounter (INDEPENDENT_AMBULATORY_CARE_PROVIDER_SITE_OTHER): Payer: Self-pay | Admitting: Ophthalmology

## 2016-03-13 DIAGNOSIS — H33303 Unspecified retinal break, bilateral: Secondary | ICD-10-CM

## 2016-04-02 ENCOUNTER — Encounter (INDEPENDENT_AMBULATORY_CARE_PROVIDER_SITE_OTHER): Payer: Self-pay | Admitting: Ophthalmology

## 2016-04-02 DIAGNOSIS — H33303 Unspecified retinal break, bilateral: Secondary | ICD-10-CM

## 2016-05-09 ENCOUNTER — Other Ambulatory Visit: Payer: Self-pay | Admitting: Family Medicine

## 2016-05-12 HISTORY — PX: COCHLEAR IMPLANT: SUR684

## 2016-06-23 ENCOUNTER — Encounter: Payer: Self-pay | Admitting: Gastroenterology

## 2016-06-26 ENCOUNTER — Other Ambulatory Visit: Payer: Self-pay | Admitting: Family Medicine

## 2016-06-29 ENCOUNTER — Telehealth: Payer: Self-pay | Admitting: Family Medicine

## 2016-06-29 DIAGNOSIS — Z0000 Encounter for general adult medical examination without abnormal findings: Secondary | ICD-10-CM

## 2016-06-29 NOTE — Telephone Encounter (Signed)
-----   Message from Ellamae Sia sent at 06/24/2016  5:08 PM EST ----- Regarding: Lab orders for Wednesday, 2.21.18 Patient is scheduled for CPX labs, please order future labs, Thanks , Karna Christmas

## 2016-06-30 ENCOUNTER — Encounter: Payer: Self-pay | Admitting: Gastroenterology

## 2016-07-02 ENCOUNTER — Other Ambulatory Visit (INDEPENDENT_AMBULATORY_CARE_PROVIDER_SITE_OTHER): Payer: Self-pay

## 2016-07-02 DIAGNOSIS — Z0000 Encounter for general adult medical examination without abnormal findings: Secondary | ICD-10-CM

## 2016-07-02 LAB — COMPREHENSIVE METABOLIC PANEL
ALT: 31 U/L (ref 0–35)
AST: 21 U/L (ref 0–37)
Albumin: 4.2 g/dL (ref 3.5–5.2)
Alkaline Phosphatase: 144 U/L — ABNORMAL HIGH (ref 39–117)
BUN: 13 mg/dL (ref 6–23)
CO2: 32 meq/L (ref 19–32)
Calcium: 9.1 mg/dL (ref 8.4–10.5)
Chloride: 102 mEq/L (ref 96–112)
Creatinine, Ser: 0.71 mg/dL (ref 0.40–1.20)
GFR: 89.69 mL/min (ref >60.00)
GLUCOSE: 94 mg/dL (ref 70–99)
POTASSIUM: 3.8 meq/L (ref 3.5–5.1)
Sodium: 138 mEq/L (ref 135–145)
Total Bilirubin: 0.7 mg/dL (ref 0.2–1.2)
Total Protein: 6.2 g/dL (ref 6.0–8.3)

## 2016-07-02 LAB — LIPID PANEL
CHOL/HDL RATIO: 3
Cholesterol: 179 mg/dL (ref 0–200)
HDL: 57.8 mg/dL (ref >39.00)
LDL Cholesterol: 96 mg/dL (ref 0–99)
NONHDL: 121.07
Triglycerides: 126 mg/dL (ref 0.0–149.0)
VLDL: 25.2 mg/dL (ref 0.0–40.0)

## 2016-07-02 LAB — CBC WITH DIFFERENTIAL/PLATELET
Basophils Absolute: 0.1 10*3/uL (ref 0.0–0.1)
Basophils Relative: 0.6 % (ref 0.0–3.0)
EOS PCT: 1.9 % (ref 0.0–5.0)
Eosinophils Absolute: 0.2 10*3/uL (ref 0.0–0.7)
HCT: 40.3 % (ref 36.0–46.0)
Hemoglobin: 13.7 g/dL (ref 12.0–15.0)
LYMPHS ABS: 2.2 10*3/uL (ref 0.7–4.0)
Lymphocytes Relative: 25.1 % (ref 12.0–46.0)
MCHC: 34 g/dL (ref 30.0–36.0)
MCV: 94.7 fl (ref 78.0–100.0)
MONOS PCT: 10.1 % (ref 3.0–12.0)
Monocytes Absolute: 0.9 10*3/uL (ref 0.1–1.0)
NEUTROS ABS: 5.6 10*3/uL (ref 1.4–7.7)
NEUTROS PCT: 62.3 % (ref 43.0–77.0)
PLATELETS: 238 10*3/uL (ref 150.0–400.0)
RBC: 4.25 Mil/uL (ref 3.87–5.11)
RDW: 12.3 % (ref 11.5–15.5)
WBC: 8.9 10*3/uL (ref 4.0–10.5)

## 2016-07-02 LAB — TSH: TSH: 3.37 u[IU]/mL (ref 0.35–4.50)

## 2016-07-07 ENCOUNTER — Ambulatory Visit (INDEPENDENT_AMBULATORY_CARE_PROVIDER_SITE_OTHER): Payer: Self-pay | Admitting: Family Medicine

## 2016-07-07 ENCOUNTER — Encounter: Payer: Self-pay | Admitting: Family Medicine

## 2016-07-07 VITALS — BP 124/66 | HR 77 | Temp 98.3°F | Ht 60.0 in | Wt 156.5 lb

## 2016-07-07 DIAGNOSIS — T8543XA Leakage of breast prosthesis and implant, initial encounter: Secondary | ICD-10-CM

## 2016-07-07 DIAGNOSIS — Z800 Family history of malignant neoplasm of digestive organs: Secondary | ICD-10-CM

## 2016-07-07 DIAGNOSIS — Z0000 Encounter for general adult medical examination without abnormal findings: Secondary | ICD-10-CM

## 2016-07-07 DIAGNOSIS — E038 Other specified hypothyroidism: Secondary | ICD-10-CM

## 2016-07-07 DIAGNOSIS — M8589 Other specified disorders of bone density and structure, multiple sites: Secondary | ICD-10-CM

## 2016-07-07 DIAGNOSIS — Q969 Turner's syndrome, unspecified: Secondary | ICD-10-CM

## 2016-07-07 MED ORDER — ESTRADIOL 1 MG PO TABS: 1.0000 mg | ORAL_TABLET | Freq: Every day | ORAL | 3 refills | Status: DC

## 2016-07-07 MED ORDER — MEDROXYPROGESTERONE ACETATE 2.5 MG PO TABS: ORAL_TABLET | ORAL | 3 refills | Status: DC

## 2016-07-07 MED ORDER — FLUOXETINE HCL 40 MG PO CAPS: 40.0000 mg | ORAL_CAPSULE | Freq: Every day | ORAL | 3 refills | Status: DC

## 2016-07-07 MED ORDER — LEVOTHYROXINE SODIUM 75 MCG PO TABS: 75.0000 ug | ORAL_TABLET | Freq: Every day | ORAL | 3 refills | Status: DC

## 2016-07-07 NOTE — Progress Notes (Signed)
Subjective:    Patient ID: Debra Ford, female    DOB: [birth year], 61 y.o.   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  This is repaired also - laser treatment   Wt Readings from Last 3 Encounters:  07/07/16 156 lb 8 oz (71 kg)  08/06/15 154 lb 4 oz (70 kg)  07/04/15 154 lb 12.8 oz (70.2 kg)  overall stable  Eating healthy Trying to exercise -has a [employer] membership- just too busy to go a lot of the time  bmi is 30.5  Hep C/ HIV screening =not high risk/ declines   Mammogram 3/17 nl (with implants) Self breast exam  She had some concerns about a ruptured implant  She saw plastic surg and they called [facility] where she had it done- but told it was nothing to worry about - /despite silicone  She feels fine   Pap/gyn care : pap 2/17 nl with neg HPV screen  No gyn symptoms at all/ no bleeding or d/c or discomfort  She chooses to stay on est/prog HRT until age 32 for hot flashes-understands risks such as breast cancer and blood clots    Flu vaccine - had it in sept  Tetanus vaccine 2/14  Colonoscopy/ screening 4/13 adenomatous polyp = has it scheduled  No bowel changes  Mother had colon cancer   dexa 3/17 osteopenia-stable to slt improved  She is on HRT  Taking her ca and D Exercise at [employer]  No falls or fractures   Hx of Turner's syndrome Cardiac status - nl echo 4/17  Hypothyroidism  Pt has no clinical changes No change in energy level/ hair or skin/ edema and no tremor Lab Results  Component Value Date   TSH 3.37 07/02/2016      LFT - up    Chemistry      Component Value Date/Time   NA 138 07/02/2016 0813   NA 141 08/23/2012 0218   K 3.8 07/02/2016 0813   K 4.2 08/23/2012 0218   CL 102 07/02/2016 0813   CL 107 08/23/2012 0218   CO2 32 07/02/2016 0813   CO2 29 08/23/2012 0218   BUN 13 07/02/2016 0813   BUN 11 08/23/2012 0218   CREATININE 0.71 07/02/2016 0813   CREATININE 0.62 08/23/2012 0218      Component Value Date/Time   CALCIUM 9.1 07/02/2016 0813   CALCIUM 8.8 08/23/2012 0218   ALKPHOS 144 (H) 07/02/2016 0813   AST 21 07/02/2016 0813   ALT 31 07/02/2016 0813   BILITOT 0.7 07/02/2016 0813     Was taking some extracts/herbal supplements So she stopped them  ? If this is related to her Turners Alk phos has been mildly elevated in the past   Lab Results  Component Value Date   WBC 8.9 07/02/2016   HGB 13.7 07/02/2016   HCT 40.3 07/02/2016   MCV 94.7 07/02/2016   PLT 238.0 07/02/2016   Cholesterol Lab Results  Component Value Date   CHOL 179 07/02/2016   CHOL 162 06/29/2015   CHOL 159 07/03/2014   Lab Results  Component Value Date   HDL 57.80 07/02/2016   HDL 60.70 06/29/2015   HDL 51.40 07/03/2014   Lab Results  Component Value Date   LDLCALC 96 07/02/2016   LDLCALC 86 06/29/2015   LDLCALC 68 07/03/2014   Lab Results  Component  Value Date   TRIG 126.0 07/02/2016   TRIG 76.0 06/29/2015   TRIG 199.0 (H) 07/03/2014   Lab Results  Component Value Date   CHOLHDL 3 07/02/2016   CHOLHDL 3 06/29/2015   CHOLHDL 3 07/03/2014   No results found for: LDLDIRECT Stable and well controlled with a fairly healthy diet   Patient Active Problem List   Diagnosis Date Noted  . Breast implant leak 08/06/2015  . Encounter for routine gynecological examination 07/04/2015  . Estrogen deficiency 07/04/2015  . Routine general medical examination at a health care facility 06/25/2014  . Family history of colon cancer 04/21/2011  . Osteopenia 12/29/2006  . Hypothyroidism 11/30/2006  . OBSESSIVE-COMPULSIVE DISORDER 11/20/2006  . DECREASED HEARING 11/20/2006  . HEMORRHOIDS, INTERNAL 11/20/2006  . TURNER'S SYNDROME 11/20/2006   Past Medical History:  Diagnosis Date  . Elevated liver function tests   . Hearing loss   . Hyperthyroidism   . OCD (obsessive compulsive disorder)   . Osteopenia   .  Turner's syndrome    Past Surgical History:  Procedure Laterality Date  . BREAST ENHANCEMENT SURGERY  1988   bilateral  . maxum  2012   hearing surgery   Social History  Substance Use Topics  . Smoking status: Never Smoker  . Smokeless tobacco: Never Used  . Alcohol use No   Family History  Problem Relation Age of Onset  . Diabetes Mother   . Cancer Mother     colon CA  . Colon cancer Mother 60  . Diabetes Father   . Cancer Father     prostate CA  . Colon polyps Father 64  . Stomach cancer Neg Hx   . Esophageal cancer Neg Hx   . Rectal cancer Neg Hx    Allergies  Allergen Reactions  . Tetanus Toxoids     Hives (?)   Current Outpatient Prescriptions on File Prior to Visit  Medication Sig Dispense Refill  . CALCIUM CITRATE-VITAMIN D PO Take 2 tablets by mouth daily.      . cholecalciferol (VITAMIN D) 1000 UNITS tablet Take 1,000 Units by mouth daily.    . ciclopirox (PENLAC) 8 % solution Apply topically at bedtime. Apply over nail and surrounding skin. Apply daily over previous coat. After seven (7) days, may remove with alcohol and continue cycle. 3 Bottle 1  . Multiple Vitamin (MULTIVITAMIN) tablet Take 1 tablet by mouth daily.      . Multiple Vitamins-Minerals (PRESERVISION/LUTEIN PO) Take 1 tablet by mouth daily.       No current facility-administered medications on file prior to visit.      Review of Systems Review of Systems  Constitutional: Negative for fever, appetite change, fatigue and unexpected weight change.  Eyes: Negative for pain and visual disturbance.  ENT pos for hearing loss  Respiratory: Negative for cough and shortness of breath.   Cardiovascular: Negative for cp or palpitations    Gastrointestinal: Negative for nausea, diarrhea and constipation.  Genitourinary: Negative for urgency and frequency.  Skin: Negative for pallor or rash   Neurological: Negative for weakness, light-headedness, numbness and headaches.  Hematological: Negative for  adenopathy. Does not bruise/bleed easily.  Psychiatric/Behavioral: Negative for dysphoric mood. The patient is not nervous/anxious.         Objective:   Physical Exam  Constitutional: She appears well-developed and well-nourished. No distress.  overwt and well appearing   HENT:  Head: Normocephalic and atraumatic.  Right Ear: External ear normal.  Left Ear: External ear normal.  Mouth/Throat: Oropharynx is clear and moist.  Eyes: Conjunctivae and EOM are normal. Pupils are equal, round, and reactive to light. No scleral icterus.  Neck: Normal range of motion. Neck supple. No JVD present. Carotid bruit is not present. No thyromegaly present.  Cardiovascular: Normal rate, regular rhythm, normal heart sounds and intact distal pulses.  Exam reveals no gallop.   Pulmonary/Chest: Effort normal and breath sounds normal. No respiratory distress. She has no wheezes. She exhibits no tenderness.  Abdominal: Soft. Bowel sounds are normal. She exhibits no distension, no abdominal bruit and no mass. There is no tenderness.  Genitourinary: No breast swelling, tenderness, discharge or bleeding.  Genitourinary Comments: Breast exam: No mass, nodules, thickening, tenderness, bulging, retraction, inflamation, nipple discharge or skin changes noted.  No axillary or clavicular LA.     Has silicone implants  R breast is generally firmer and more dense feeling than the L one   Musculoskeletal: Normal range of motion. She exhibits no edema or tenderness.  Lymphadenopathy:    She has no cervical adenopathy.  Neurological: She is alert. She has normal reflexes. No cranial nerve deficit. She exhibits normal muscle tone. Coordination normal.  Skin: Skin is warm and dry. No rash noted. No erythema. No pallor.  Psychiatric: She has a normal mood and affect.          Assessment & Plan:   Problem List Items Addressed This Visit      Endocrine   Hypothyroidism - Primary    In pt with Turner's  syndrome Hypothyroidism  Pt has no clinical changes No change in energy level/ hair or skin/ edema and no tremor Lab Results  Component Value Date   TSH 3.37 07/02/2016          Relevant Medications   levothyroxine (SYNTHROID, LEVOTHROID) 75 MCG tablet     Musculoskeletal and Integument   Osteopenia    Stable dexa 3/17 No falls or fx On HRT (not for this)  On ca and D Enc exercise  Re check in about a year         Genitourinary   TURNER'S SYNDROME    Re assuring echocardiogram last year Continue to monitor        Other   Breast implant leak    Pt states she saw 2 plastic surgeons- chose to obs it for now        Family history of colon cancer    Due for screening colonoscopy and it is already scheduled       Routine general medical examination at a health care facility    Reviewed health habits including diet and exercise and skin cancer prevention Reviewed appropriate screening tests for age  Also reviewed health mt list, fam hx and immunization status , as well as social and family history   See HPI Labs reviewed  She will schedule mammogram (known ruptured implant and saw plastic surg-chose to monitor) utd gyn care Utd vaccines utd dexa  Watching onn specific alk phos elevation-chronic

## 2016-07-07 NOTE — Assessment & Plan Note (Signed)
Reviewed health habits including diet and exercise and skin cancer prevention Reviewed appropriate screening tests for age  Also reviewed health mt list, fam hx and immunization status , as well as social and family history   See HPI Labs reviewed  She will schedule mammogram (known ruptured implant and saw plastic surg-chose to monitor) utd gyn care Utd vaccines utd dexa  Watching onn specific alk phos elevation-chronic

## 2016-07-07 NOTE — Assessment & Plan Note (Signed)
In pt with Turner's syndrome Hypothyroidism  Pt has no clinical changes No change in energy level/ hair or skin/ edema and no tremor Lab Results  Component Value Date   TSH 3.37 07/02/2016

## 2016-07-07 NOTE — Progress Notes (Signed)
Pre visit review using our clinic review tool, if applicable. No additional management support is needed unless otherwise documented below in the visit note. 

## 2016-07-07 NOTE — Assessment & Plan Note (Signed)
Pt states she saw 2 plastic surgeons- chose to obs it for now

## 2016-07-07 NOTE — Assessment & Plan Note (Signed)
Re assuring echocardiogram last year Continue to monitor

## 2016-07-07 NOTE — Assessment & Plan Note (Signed)
Stable dexa 3/17 No falls or fx On HRT (not for this)  On ca and D Enc exercise  Re check in about a year

## 2016-07-07 NOTE — Assessment & Plan Note (Signed)
Due for screening colonoscopy and it is already scheduled

## 2016-07-07 NOTE — Patient Instructions (Addendum)
Don't forget to schedule your mammogram next month - please ask them to send [place] reports (make sure they are aware of the implant rupture)  Get your colonoscopy as planned  Keep working on healthy diet and exercise   Take care of yourself

## 2016-07-18 ENCOUNTER — Encounter: Payer: Self-pay | Admitting: Family Medicine

## 2016-08-22 ENCOUNTER — Ambulatory Visit (AMBULATORY_SURGERY_CENTER): Payer: Self-pay | Admitting: *Deleted

## 2016-08-22 VITALS — Ht 60.0 in | Wt 155.4 lb

## 2016-08-22 DIAGNOSIS — Z86010 Personal history of colonic polyps: Secondary | ICD-10-CM

## 2016-08-22 NOTE — Progress Notes (Signed)
Pt denies allergies to eggs or soy products. Denies difficulty with sedation or anesthesia. Denies any diet or weight loss medications. Denies use of supplemental oxygen.  Emmi instructions given for procedure.  

## 2016-08-26 ENCOUNTER — Encounter: Payer: Self-pay | Admitting: Gastroenterology

## 2016-08-28 ENCOUNTER — Telehealth: Payer: Self-pay | Admitting: *Deleted

## 2016-08-28 NOTE — Telephone Encounter (Signed)
Called script of suprep to [facility] as requested by patient.  It did not go through in pre-visit as ordered.

## 2016-09-01 MED ORDER — NA SULFATE-K SULFATE-MG SULF 17.5-3.13-1.6 GM/177ML PO SOLN: ORAL | 0 refills | Status: DC

## 2016-09-05 ENCOUNTER — Encounter: Payer: Self-pay | Admitting: Gastroenterology

## 2016-09-05 ENCOUNTER — Ambulatory Visit (AMBULATORY_SURGERY_CENTER): Payer: Self-pay | Admitting: Gastroenterology

## 2016-09-05 VITALS — BP 112/63 | HR 75 | Temp 97.7°F | Resp 16 | Ht 60.0 in | Wt 156.0 lb

## 2016-09-05 DIAGNOSIS — D123 Benign neoplasm of transverse colon: Secondary | ICD-10-CM

## 2016-09-05 DIAGNOSIS — Z86010 Personal history of colonic polyps: Secondary | ICD-10-CM

## 2016-09-05 MED ORDER — SODIUM CHLORIDE 0.9 % IV SOLN: 500.0000 mL | INTRAVENOUS | Status: DC

## 2016-09-05 NOTE — Progress Notes (Signed)
Pt's states no medical or surgical changes since previsit or office visit. 

## 2016-09-05 NOTE — Patient Instructions (Signed)
YOU HAD AN ENDOSCOPIC PROCEDURE TODAY AT THE [facility]:   Refer to the procedure report that was given to you for any specific questions about what was found during the examination.  If the procedure report does not answer your questions, please call your gastroenterologist to clarify.  If you requested that your care partner not be given the details of your procedure findings, then the procedure report has been included in a sealed envelope for you to review at your convenience later.  YOU SHOULD EXPECT: Some feelings of bloating in the abdomen. Passage of more gas than usual.  Walking can help get rid of the air that was put into your GI tract during the procedure and reduce the bloating. If you had a lower endoscopy (such as a colonoscopy or flexible sigmoidoscopy) you may notice spotting of blood in your stool or on the toilet paper. If you underwent a bowel prep for your procedure, you may not have a normal bowel movement for a few days.  Please Note:  You might notice some irritation and congestion in your nose or some drainage.  This is from the oxygen used during your procedure.  There is no need for concern and it should clear up in a day or so.  SYMPTOMS TO REPORT IMMEDIATELY:   Following lower endoscopy (colonoscopy or flexible sigmoidoscopy):  Excessive amounts of blood in the stool  Significant tenderness or worsening of abdominal pains  Swelling of the abdomen that is new, acute  Fever of 100F or higher  For urgent or emergent issues, a gastroenterologist can be reached at any hour by calling [phone].   DIET:  We do recommend a small meal at first, but then you may proceed to your regular diet.  Drink plenty of fluids but you should avoid alcoholic beverages for 24 hours.  MEDICATIONS: Continue present medications.  Please see handouts given to you by your recovery nurse.  ACTIVITY:  You should plan to take it easy for the rest of today and you should NOT  DRIVE or use heavy machinery until tomorrow (because of the sedation medicines used during the test).    FOLLOW UP: Our staff will call the number listed on your records the next business day following your procedure to check on you and address any questions or concerns that you may have regarding the information given to you following your procedure. If we do not reach you, we will leave a message.  However, if you are feeling well and you are not experiencing any problems, there is no need to return our call.  We will assume that you have returned to your regular daily activities without incident.  If any biopsies were taken you will be contacted by phone or by letter within the next 1-3 weeks.  Please call us at [phone] if you have not heard about the biopsies in 3 weeks.   Thank you for allowing us to provide for your healthcare needs today.   SIGNATURES/CONFIDENTIALITY: You and/or your care partner have signed paperwork which will be entered into your electronic medical record.  These signatures attest to the fact that that the information above on your After Visit Summary has been reviewed and is understood.  Full responsibility of the confidentiality of this discharge information lies with you and/or your care-partner. 

## 2016-09-05 NOTE — Progress Notes (Signed)
Called to room to assist during endoscopic procedure.  Patient ID and intended procedure confirmed with present staff. Received instructions for my participation in the procedure from the performing physician.  

## 2016-09-05 NOTE — Op Note (Signed)
[facility] Patient Name: Debra Ford Procedure Date: 09/05/2016 10:43 AM MRN: [id number] Endoscopist: Mauri Pole , MD Age: 61 Referring MD:  Date of Birth: Oct 25, 1955 Gender: Female Account #: [id number] Procedure:                Colonoscopy Indications:              Surveillance: Personal history of adenomatous                            polyps on last colonoscopy 5 years ago Medicines:                Monitored Anesthesia Care Procedure:                Pre-Anesthesia Assessment:                           - Prior to the procedure, a History and Physical                            was performed, and patient medications and                            allergies were reviewed. The patient's tolerance of                            previous anesthesia was also reviewed. The risks                            and benefits of the procedure and the sedation                            options and risks were discussed with the patient.                            All questions were answered, and informed consent                            was obtained. Prior Anticoagulants: The patient has                            taken no previous anticoagulant or antiplatelet                            agents. ASA Grade Assessment: II - A patient with                            mild systemic disease. After reviewing the risks                            and benefits, the patient was deemed in                            satisfactory condition to undergo the procedure.  After obtaining informed consent, the colonoscope                            was passed under direct vision. Throughout the                            procedure, the patient's blood pressure, pulse, and                            oxygen saturations were monitored continuously. The                            Colonoscope was introduced through the anus and                            advanced to the the  terminal ileum, with                            identification of the appendiceal orifice and IC                            valve. The colonoscopy was performed without                            difficulty. The patient tolerated the procedure                            well. The quality of the bowel preparation was                            excellent. The terminal ileum, ileocecal valve,                            appendiceal orifice, and rectum were photographed. Scope In: 10:48:03 AM Scope Out: 11:07:38 AM Scope Withdrawal Time: 0 hours 14 minutes 7 seconds  Total Procedure Duration: 0 hours 19 minutes 35 seconds  Findings:                 The perianal and digital rectal examinations were                            normal.                           A 3 mm polyp was found in the transverse colon. The                            polyp was sessile. The polyp was removed with a                            cold snare. Resection and retrieval were complete.                           Non-bleeding internal hemorrhoids were found during  retroflexion. The hemorrhoids were small. Complications:            No immediate complications. Estimated Blood Loss:     Estimated blood loss was minimal. Impression:               - One 3 mm polyp in the transverse colon, removed                            with a cold snare. Resected and retrieved.                           - Non-bleeding internal hemorrhoids. Recommendation:           - Patient has a contact number available for                            emergencies. The signs and symptoms of potential                            delayed complications were discussed with the                            patient. Return to normal activities tomorrow.                            Written discharge instructions were provided to the                            patient.                           - Resume previous diet.                            - Continue present medications.                           - Await pathology results.                           - Repeat colonoscopy in 5 years for surveillance                            based on pathology results. Mauri Pole, MD 09/05/2016 11:11:47 AM This report has been signed electronically.

## 2016-09-05 NOTE — Progress Notes (Signed)
A and O x3. Report to RN. Tolerated MAC anesthesia well.

## 2016-09-08 ENCOUNTER — Telehealth: Payer: Self-pay

## 2016-09-08 NOTE — Telephone Encounter (Signed)
  Follow up Call-  Call back number 09/05/2016  Post procedure Call Back phone  # [phone]  Permission to leave phone message Yes  Some recent data might be hidden     Patient questions:  Do you have a fever, pain , or abdominal swelling? No. Pain Score  0 *  Have you tolerated food without any problems? Yes.    Have you been able to return to your normal activities? Yes.    Do you have any questions about your discharge instructions: Diet   No. Medications  No. Follow up visit  No.  Do you have questions or concerns about your Care? No.  Actions: * If pain score is 4 or above: No action needed, pain <4.

## 2016-09-11 ENCOUNTER — Encounter: Payer: Self-pay | Admitting: Gastroenterology

## 2016-10-29 ENCOUNTER — Encounter (INDEPENDENT_AMBULATORY_CARE_PROVIDER_SITE_OTHER): Payer: Self-pay | Admitting: Ophthalmology

## 2016-10-29 DIAGNOSIS — H43813 Vitreous degeneration, bilateral: Secondary | ICD-10-CM

## 2016-10-29 DIAGNOSIS — H33303 Unspecified retinal break, bilateral: Secondary | ICD-10-CM

## 2017-01-09 ENCOUNTER — Telehealth: Payer: Self-pay | Admitting: Family Medicine

## 2017-01-09 NOTE — Telephone Encounter (Signed)
Debra Ford is faxing over a form with all the tests they need done for a pre-op clearance for Dr.Kraus.

## 2017-01-12 NOTE — Telephone Encounter (Signed)
Will review when it comes in

## 2017-01-13 ENCOUNTER — Ambulatory Visit (INDEPENDENT_AMBULATORY_CARE_PROVIDER_SITE_OTHER)
Admission: RE | Admit: 2017-01-13 | Discharge: 2017-01-13 | Disposition: A | Discharge status: Home / Self Care | Payer: Self-pay | Source: Ambulatory Visit | Attending: Family Medicine | Admitting: Family Medicine

## 2017-01-13 ENCOUNTER — Encounter: Payer: Self-pay | Admitting: Family Medicine

## 2017-01-13 ENCOUNTER — Ambulatory Visit (INDEPENDENT_AMBULATORY_CARE_PROVIDER_SITE_OTHER): Payer: Self-pay | Admitting: Family Medicine

## 2017-01-13 VITALS — BP 108/56 | HR 82 | Temp 98.2°F | Ht 60.0 in | Wt 149.8 lb

## 2017-01-13 DIAGNOSIS — Z01810 Encounter for preprocedural cardiovascular examination: Secondary | ICD-10-CM

## 2017-01-13 DIAGNOSIS — Z01818 Encounter for other preprocedural examination: Secondary | ICD-10-CM

## 2017-01-13 DIAGNOSIS — Z23 Encounter for immunization: Secondary | ICD-10-CM

## 2017-01-13 DIAGNOSIS — H9193 Unspecified hearing loss, bilateral: Secondary | ICD-10-CM

## 2017-01-13 LAB — APTT: aPTT: 34.4 s — ABNORMAL HIGH (ref 23.4–32.7)

## 2017-01-13 LAB — COMPREHENSIVE METABOLIC PANEL
ALBUMIN: 4.1 g/dL (ref 3.5–5.2)
ALT: 28 U/L (ref 0–35)
AST: 19 U/L (ref 0–37)
Alkaline Phosphatase: 117 U/L (ref 39–117)
BUN: 11 mg/dL (ref 6–23)
CHLORIDE: 103 meq/L (ref 96–112)
CO2: 32 meq/L (ref 19–32)
CREATININE: 0.83 mg/dL (ref 0.40–1.20)
Calcium: 9.4 mg/dL (ref 8.4–10.5)
GFR: 74.76 mL/min (ref >60.00)
Glucose, Bld: 65 mg/dL — ABNORMAL LOW (ref 70–99)
POTASSIUM: 4 meq/L (ref 3.5–5.1)
SODIUM: 141 meq/L (ref 135–145)
Total Bilirubin: 0.5 mg/dL (ref 0.2–1.2)
Total Protein: 6.1 g/dL (ref 6.0–8.3)

## 2017-01-13 LAB — CBC WITH DIFFERENTIAL/PLATELET
BASOS PCT: 0.5 % (ref 0.0–3.0)
Basophils Absolute: 0 10*3/uL (ref 0.0–0.1)
EOS PCT: 0.8 % (ref 0.0–5.0)
Eosinophils Absolute: 0.1 10*3/uL (ref 0.0–0.7)
HCT: 39.7 % (ref 36.0–46.0)
HEMOGLOBIN: 13.2 g/dL (ref 12.0–15.0)
Lymphocytes Relative: 19.2 % (ref 12.0–46.0)
Lymphs Abs: 1.9 10*3/uL (ref 0.7–4.0)
MCHC: 33.2 g/dL (ref 30.0–36.0)
MCV: 96.3 fl (ref 78.0–100.0)
MONO ABS: 0.9 10*3/uL (ref 0.1–1.0)
Monocytes Relative: 9.5 % (ref 3.0–12.0)
NEUTROS PCT: 70 % (ref 43.0–77.0)
Neutro Abs: 6.8 10*3/uL (ref 1.4–7.7)
Platelets: 245 10*3/uL (ref 150.0–400.0)
RBC: 4.12 Mil/uL (ref 3.87–5.11)
RDW: 13.1 % (ref 11.5–15.5)
WBC: 9.7 10*3/uL (ref 4.0–10.5)

## 2017-01-13 LAB — PROTIME-INR
INR: 1 ratio (ref 0.8–1.0)
PROTHROMBIN TIME: 10.7 s (ref 9.6–13.1)

## 2017-01-13 NOTE — Progress Notes (Signed)
Subjective:    Patient ID: Debra Ford, female    DOB: 28-Nov-1955, 62 y.o.   MRN: [id number]  HPI  Here for pre operative exam for cochlear implant surgery with general anesthesia   She is getting excited   First time she had anesthesia she was nauseated   Drug allergy- tetanus shot  Hives w/o anaphylaxis  No hx of severe allergic reaction to anything   She has hx of Turner's syndrome and hearing loss  Sees Dr Cresenciano Lick   Will need cbc with diff/ cmet/ PT and PTT/CXR  req PNA 23 if needed to prevent post op meningitis  Will get her flu shot today also   EKG rate of 76 with normal sinus rhythm and RSR V1 (non diagnostic)  Low voltage in all leads  No hx of pulmonary problems   Pulse ox 95% on RA  Hypothyroidism  Pt has no clinical changes No change in energy level/ hair or skin/ edema and no tremor Lab Results  Component Value Date   TSH 3.37 07/02/2016     BP Readings from Last 3 Encounters:  01/13/17 (!) 108/56  09/05/16 112/63  07/07/16 124/66   Pulse Readings from Last 3 Encounters:  01/13/17 82  09/05/16 75  07/07/16 77   No hx of heart problems  Had echo 2017 that looked good   She has never smoked   Patient Active Problem List   Diagnosis Date Noted  . Pre-operative examination 01/13/2017  . Breast implant leak 08/06/2015  . Encounter for routine gynecological examination 07/04/2015  . Estrogen deficiency 07/04/2015  . Routine general medical examination at a health care facility 06/25/2014  . Family history of colon cancer 04/21/2011  . Osteopenia 12/29/2006  . Hypothyroidism 11/30/2006  . OBSESSIVE-COMPULSIVE DISORDER 11/20/2006  . Hearing loss 11/20/2006  . HEMORRHOIDS, INTERNAL 11/20/2006  . TURNER'S SYNDROME 11/20/2006   Past Medical History:  Diagnosis Date  . Allergy    seasonal  . Cancer ([place])    skin cancer on nose  . Cataract   . Elevated liver function tests   . Hearing loss   . Hyperthyroidism   . OCD (obsessive  compulsive disorder)   . Osteopenia   . Turner's syndrome    Past Surgical History:  Procedure Laterality Date  . BREAST ENHANCEMENT SURGERY  1988   bilateral  . COLONOSCOPY     2013  . maxum  2012   hearing surgery   Social History  Substance Use Topics  . Smoking status: Never Smoker  . Smokeless tobacco: Never Used  . Alcohol use No   Family History  Problem Relation Age of Onset  . Colon cancer Mother 40  . Diabetes Father   . Colon polyps Father 3  . Heart disease Father   . Prostate cancer Father   . Diabetes Maternal Grandmother   . Diabetes Paternal Grandmother   . Diabetes Paternal Grandfather   . Stomach cancer Neg Hx   . Esophageal cancer Neg Hx   . Rectal cancer Neg Hx    Allergies  Allergen Reactions  . Tetanus Toxoids     Hives (?)   Current Outpatient Prescriptions on File Prior to Visit  Medication Sig Dispense Refill  . CALCIUM CITRATE-VITAMIN D PO Take 2 tablets by mouth daily.      . cholecalciferol (VITAMIN D) 1000 UNITS tablet Take 1,000 Units by mouth daily.    Marland Kitchen estradiol (ESTRACE) 1 MG tablet Take 1 tablet (1 mg total)  by mouth daily. With medroxyprogesterone 90 tablet 3  . fexofenadine-pseudoephedrine (ALLEGRA-D 24) 180-240 MG 24 hr tablet Take 1 tablet by mouth daily.    Marland Kitchen FLUoxetine (PROZAC) 40 MG capsule Take 1 capsule (40 mg total) by mouth daily. 90 capsule 3  . levothyroxine (SYNTHROID, LEVOTHROID) 75 MCG tablet Take 1 tablet (75 mcg total) by mouth daily. 90 tablet 3  . Multiple Vitamin (MULTIVITAMIN) tablet Take 1 tablet by mouth daily.       Current Facility-Administered Medications on File Prior to Visit  Medication Dose Route Frequency Provider Last Rate Last Dose  . 0.9 %  sodium chloride infusion  500 mL Intravenous Continuous Nandigam, Kavitha V, MD         Review of Systems Review of Systems  Constitutional: Negative for fever, appetite change, fatigue and unexpected weight change.  Eyes: Negative for pain and visual  disturbance.  ENT pos for recent uri 2 wk ago that is better , pos for chronic hearing loss  Respiratory: Negative for cough and shortness of breath.   Cardiovascular: Negative for cp or palpitations    Gastrointestinal: Negative for nausea, diarrhea and constipation.  Genitourinary: Negative for urgency and frequency.  Skin: Negative for pallor or rash   Neurological: Negative for weakness, light-headedness, numbness and headaches.  Hematological: Negative for adenopathy. Does not bruise/bleed easily.  Psychiatric/Behavioral: Negative for dysphoric mood. The patient is not nervous/anxious.         Objective:   Physical Exam  Constitutional: She appears well-developed and well-nourished. No distress.  Well appearing overwt pt with Turner's syndrome  HENT:  Head: Normocephalic and atraumatic.  Mouth/Throat: Oropharynx is clear and moist.  Hearing aides noted  Pt does also read lips  Eyes: Pupils are equal, round, and reactive to light. Conjunctivae and EOM are normal.  Neck: Normal range of motion. Neck supple. No JVD present. Carotid bruit is not present. No thyromegaly present.  Cardiovascular: Normal rate, regular rhythm, normal heart sounds and intact distal pulses.  Exam reveals no gallop.   Pulmonary/Chest: Effort normal and breath sounds normal. No respiratory distress. She has no wheezes. She has no rales.  No crackles  Abdominal: Soft. Bowel sounds are normal. She exhibits no distension, no abdominal bruit and no mass. There is no tenderness.  Musculoskeletal: She exhibits no edema.  Baseline R foot deformity   Good perfusion of extremities   Lymphadenopathy:    She has no cervical adenopathy.  Neurological: She is alert. She has normal reflexes. No cranial nerve deficit. She exhibits normal muscle tone. Coordination normal.  Skin: Skin is warm and dry. No rash noted. No pallor.  Psychiatric: She has a normal mood and affect.  Pleasant and talkative             Assessment & Plan:   Problem List Items Addressed This Visit      Nervous and Auditory   Hearing loss    Pending date for cochlear implant surgery with Dr Cresenciano Lick         Other   Pre-operative examination - Primary    Cleared for surgery thus far pending labs and cxr reading  ekg is re assuring  No hx of anaphylaxis  No nsaids/blood thinners  No hx of cardio/pulm problems  Recent echocardiogram is re assuring as well  Please note that pt has hx of nausea with anesthesia once in the past  Flu shot today  Pneumonia shot today   Allergic to Td in the past (hives)  Relevant Orders   CBC with Differential/Platelet   Comprehensive metabolic panel   Protime-INR   APTT   DG Chest 2 View    Other Visit Diagnoses    Pre-operative cardiovascular examination       Relevant Orders   EKG 12-Lead (Completed)   Need for 23-polyvalent pneumococcal polysaccharide vaccine       Relevant Orders   Pneumococcal polysaccharide vaccine 23-valent greater than or equal to 2yo subcutaneous/IM (Completed)   Need for influenza vaccination       Relevant Orders   Flu Vaccine QUAD 6+ mos PF IM (Fluarix Quad PF) (Completed)

## 2017-01-13 NOTE — Patient Instructions (Signed)
Labs today  Pneumonia vaccine today  Flu shot today Chest xray today   I think you will be low risk for surgery   Good luck with everything  I will send results to Dr Cresenciano Lick

## 2017-01-13 NOTE — Assessment & Plan Note (Addendum)
Cleared for surgery thus far pending labs and cxr reading  ekg is re assuring  No hx of anaphylaxis  No nsaids/blood thinners  No hx of cardio/pulm problems  Recent echocardiogram is re assuring as well  Please note that pt has hx of nausea with anesthesia once in the past  Flu shot today  Pneumonia shot today   Allergic to Td in the past (hives)

## 2017-01-13 NOTE — Assessment & Plan Note (Signed)
Pending date for cochlear implant surgery with Dr Cresenciano Lick

## 2017-02-10 ENCOUNTER — Telehealth: Payer: Self-pay

## 2017-02-10 NOTE — Telephone Encounter (Signed)
Debra Ford with Dr Cresenciano Lick left v/m that pt was seen on 01/13/17 for preop clearance. Debra Ford received the lab results but still needs documentation that pt had pneumonia vaccine,CXR,copy of EKG and needs note in writing that pt is cleared to have general anesthesia to be put to sleep. Debra Ford request cb ASAP.

## 2017-02-10 NOTE — Telephone Encounter (Signed)
I double check my faxes and when I faxed over the surgical clearance form I also faxed over Dr. Marliss Coots OV note, labs, EKG, and chest xray. I called Dr. Doreene Burke office and Olivia Mackie wasn't available so I left message to have her call me back. I would like her to recheck the fax I sent

## 2017-02-10 NOTE — Telephone Encounter (Signed)
Spoke with Olivia Mackie Dr. Doreene Burke nurse and she advise me that she did receive the fax I sent with all of pt's info, nothing further needs to be done

## 2017-06-28 ENCOUNTER — Telehealth: Payer: Self-pay | Admitting: Family Medicine

## 2017-06-28 DIAGNOSIS — E038 Other specified hypothyroidism: Secondary | ICD-10-CM

## 2017-06-28 DIAGNOSIS — Z0000 Encounter for general adult medical examination without abnormal findings: Secondary | ICD-10-CM

## 2017-06-28 NOTE — Telephone Encounter (Signed)
-----   Message from Ellamae Sia sent at 06/23/2017  4:18 PM EST ----- Regarding: Lab orders for Friday, 2.22.19 Patient is scheduled for CPX labs, please order future labs, Thanks , Karna Christmas

## 2017-07-03 ENCOUNTER — Other Ambulatory Visit (INDEPENDENT_AMBULATORY_CARE_PROVIDER_SITE_OTHER): Payer: Self-pay

## 2017-07-03 DIAGNOSIS — E038 Other specified hypothyroidism: Secondary | ICD-10-CM

## 2017-07-03 DIAGNOSIS — Z0000 Encounter for general adult medical examination without abnormal findings: Secondary | ICD-10-CM

## 2017-07-03 LAB — LIPID PANEL
CHOL/HDL RATIO: 3
Cholesterol: 165 mg/dL (ref 0–200)
HDL: 59.6 mg/dL (ref >39.00)
LDL CALC: 84 mg/dL (ref 0–99)
NonHDL: 105.65
TRIGLYCERIDES: 109 mg/dL (ref 0.0–149.0)
VLDL: 21.8 mg/dL (ref 0.0–40.0)

## 2017-07-03 LAB — CBC WITH DIFFERENTIAL/PLATELET
BASOS PCT: 0.7 % (ref 0.0–3.0)
Basophils Absolute: 0.1 10*3/uL (ref 0.0–0.1)
Eosinophils Absolute: 0.1 10*3/uL (ref 0.0–0.7)
Eosinophils Relative: 1.4 % (ref 0.0–5.0)
HEMATOCRIT: 40.3 % (ref 36.0–46.0)
Hemoglobin: 13.4 g/dL (ref 12.0–15.0)
LYMPHS ABS: 2.3 10*3/uL (ref 0.7–4.0)
LYMPHS PCT: 26.6 % (ref 12.0–46.0)
MCHC: 33.3 g/dL (ref 30.0–36.0)
MCV: 95.8 fl (ref 78.0–100.0)
MONOS PCT: 11.2 % (ref 3.0–12.0)
Monocytes Absolute: 1 10*3/uL (ref 0.1–1.0)
NEUTROS ABS: 5.2 10*3/uL (ref 1.4–7.7)
NEUTROS PCT: 60.1 % (ref 43.0–77.0)
PLATELETS: 228 10*3/uL (ref 150.0–400.0)
RBC: 4.21 Mil/uL (ref 3.87–5.11)
RDW: 12.8 % (ref 11.5–15.5)
WBC: 8.6 10*3/uL (ref 4.0–10.5)

## 2017-07-03 LAB — COMPREHENSIVE METABOLIC PANEL
ALT: 30 U/L (ref 0–35)
AST: 20 U/L (ref 0–37)
Albumin: 4 g/dL (ref 3.5–5.2)
Alkaline Phosphatase: 109 U/L (ref 39–117)
BILIRUBIN TOTAL: 0.9 mg/dL (ref 0.2–1.2)
BUN: 14 mg/dL (ref 6–23)
CALCIUM: 9.1 mg/dL (ref 8.4–10.5)
CHLORIDE: 101 meq/L (ref 96–112)
CO2: 29 mEq/L (ref 19–32)
CREATININE: 0.71 mg/dL (ref 0.40–1.20)
GFR: 89.38 mL/min (ref >60.00)
GLUCOSE: 90 mg/dL (ref 70–99)
Potassium: 3.9 mEq/L (ref 3.5–5.1)
Sodium: 138 mEq/L (ref 135–145)
Total Protein: 6 g/dL (ref 6.0–8.3)

## 2017-07-03 LAB — TSH: TSH: 5.11 u[IU]/mL — ABNORMAL HIGH (ref 0.35–4.50)

## 2017-07-10 ENCOUNTER — Encounter: Payer: Self-pay | Admitting: Family Medicine

## 2017-07-10 ENCOUNTER — Ambulatory Visit: Payer: Self-pay | Admitting: Family Medicine

## 2017-07-10 VITALS — BP 132/70 | HR 99 | Temp 98.3°F | Ht 59.75 in | Wt 154.5 lb

## 2017-07-10 DIAGNOSIS — M8589 Other specified disorders of bone density and structure, multiple sites: Secondary | ICD-10-CM

## 2017-07-10 DIAGNOSIS — T8543XS Leakage of breast prosthesis and implant, sequela: Secondary | ICD-10-CM

## 2017-07-10 DIAGNOSIS — E038 Other specified hypothyroidism: Secondary | ICD-10-CM

## 2017-07-10 DIAGNOSIS — Z800 Family history of malignant neoplasm of digestive organs: Secondary | ICD-10-CM

## 2017-07-10 DIAGNOSIS — H9193 Unspecified hearing loss, bilateral: Secondary | ICD-10-CM

## 2017-07-10 DIAGNOSIS — F429 Obsessive-compulsive disorder, unspecified: Secondary | ICD-10-CM

## 2017-07-10 DIAGNOSIS — Q969 Turner's syndrome, unspecified: Secondary | ICD-10-CM

## 2017-07-10 DIAGNOSIS — Z0000 Encounter for general adult medical examination without abnormal findings: Secondary | ICD-10-CM

## 2017-07-10 MED ORDER — LEVOTHYROXINE SODIUM 88 MCG PO TABS: 88.0000 ug | ORAL_TABLET | Freq: Every day | ORAL | 11 refills | Status: DC

## 2017-07-10 MED ORDER — ALPRAZOLAM 0.5 MG PO TABS: 0.2500 mg | ORAL_TABLET | Freq: Every day | ORAL | 0 refills | Status: DC | PRN

## 2017-07-10 MED ORDER — FLUOXETINE HCL 40 MG PO CAPS: 40.0000 mg | ORAL_CAPSULE | Freq: Every day | ORAL | 3 refills | Status: DC

## 2017-07-10 NOTE — Assessment & Plan Note (Signed)
Hypothyroidism  Pt has no clinical changes No change in energy level/ hair or skin/ edema and no tremor Lab Results  Component Value Date   TSH 5.11 (H) 07/03/2017     Will increase levothy to 88 mcg daily  She is taking it in the right fashion

## 2017-07-10 NOTE — Assessment & Plan Note (Signed)
On ca and D No falls or fx Disc need for calcium/ vitamin D/ wt bearing exercise and bone density test every 2 y to monitor Disc safety/ fracture risk in detail    Pt wants to put off dexa one more year  Disc plan for exercise  She will be coming off of HRT as well

## 2017-07-10 NOTE — Patient Instructions (Addendum)
For home exercise - plan it in the ams  Think about getting a treadmill for bad weather and walk outside when it is nice    Don't forget to schedule your mammogram   You can go off the hormones any time - you may have some hot flashes and menopausal symptoms   We will change your thyroid dose to 88 mcg and re check that in 6 weeks   Take care of yourself Blood work looks good

## 2017-07-10 NOTE — Assessment & Plan Note (Signed)
Reviewed health habits including diet and exercise and skin cancer prevention Reviewed appropriate screening tests for age  Also reviewed health mt list, fam hx and immunization status , as well as social and family history   See HPI Labs rev Disc plan to start exercise Continue ca and D- will put off dexa another year She will schedule her own mammogram  Inc levothy to 88 mcg and re check in 6 wk  Plans on stopping HRT due to age-disc expectations

## 2017-07-10 NOTE — Assessment & Plan Note (Signed)
Well controlled with prozac  Has xanax prn for emergency anxiety (uses very rarely)

## 2017-07-10 NOTE — Assessment & Plan Note (Signed)
No changes Rev last echo

## 2017-07-10 NOTE — Assessment & Plan Note (Signed)
Doing better now with cochlear implant

## 2017-07-10 NOTE — Progress Notes (Signed)
Subjective:    Patient ID: Debra Ford, female    DOB: 09/27/1955, 62 y.o.   MRN: [id number]  HPI Here for health maintenance exam and to review chronic medical problems    Doing well  Had cochlear implant surgery- getting used to it Does like it in general   Wt Readings from Last 3 Encounters:  07/10/17 154 lb 8 oz (70.1 kg)  01/13/17 149 lb 12 oz (67.9 kg)  09/05/16 156 lb (70.8 kg)  taking care of herself  Eating healthy 2/3 of the time  Exercise- not now / curves closed - no other options / has to do at home /has a hard time scheduling it   30.43 kg/m   Mammogram (with implants) 3/18 nl - will make her own appt  Both implants are ruptured but not bothering her  Self breast exam -no change   Gyn care  Pap 2/17 with neg hpv screen No gyn problems  No hx of abn paps  On est/prog HRT  Plans to stop the HRT this year    Flu shot 9/18 Tetanus vaccine 2/14  Colonoscopy 4/18 adenomatous polyp - 5 y recall  Mother had colon cancer   dexa 3/17 Osteopenia stable  On HRT-plans to stop it  Ca and D Exercise  No falls or fractures  Wants to do next dexa in 1 year   Turner's syndrome Cardiac status-nl echo 4/17  Hypothyroidism  Pt has no clinical changes No change in energy level/ hair or skin/ edema and no tremor Lab Results  Component Value Date   TSH 5.11 (H) 07/03/2017   no recent missed doses   bp BP Readings from Last 3 Encounters:  07/10/17 132/70  01/13/17 (!) 108/56  09/05/16 112/63   Lab Results  Component Value Date   CREATININE 0.71 07/03/2017   BUN 14 07/03/2017   NA 138 07/03/2017   K 3.9 07/03/2017   CL 101 07/03/2017   CO2 29 07/03/2017   Lab Results  Component Value Date   ALT 30 07/03/2017   AST 20 07/03/2017   ALKPHOS 109 07/03/2017   BILITOT 0.9 07/03/2017     Cholesterol Lab Results  Component Value Date   CHOL 165 07/03/2017   CHOL 179 07/02/2016   CHOL 162 06/29/2015   Lab Results  Component Value Date   HDL  59.60 07/03/2017   HDL 57.80 07/02/2016   HDL 60.70 06/29/2015   Lab Results  Component Value Date   LDLCALC 84 07/03/2017   LDLCALC 96 07/02/2016   LDLCALC 86 06/29/2015   Lab Results  Component Value Date   TRIG 109.0 07/03/2017   TRIG 126.0 07/02/2016   TRIG 76.0 06/29/2015   Lab Results  Component Value Date   CHOLHDL 3 07/03/2017   CHOLHDL 3 07/02/2016   CHOLHDL 3 06/29/2015   No results found for: LDLDIRECT   Lab Results  Component Value Date   WBC 8.6 07/03/2017   HGB 13.4 07/03/2017   HCT 40.3 07/03/2017   MCV 95.8 07/03/2017   PLT 228.0 07/03/2017     She gets occ anxiety and takes a xanax  Does not drive with it  For example-when her father had open heart surgery    Patient Active Problem List   Diagnosis Date Noted  . Pre-operative examination 01/13/2017  . Breast implant leak 08/06/2015  . Encounter for routine gynecological examination 07/04/2015  . Estrogen deficiency 07/04/2015  . Routine general medical examination at a health care  facility 06/25/2014  . Family history of colon cancer 04/21/2011  . Osteopenia 12/29/2006  . Hypothyroidism 11/30/2006  . Obsessive-compulsive disorder 11/20/2006  . Hearing loss 11/20/2006  . HEMORRHOIDS, INTERNAL 11/20/2006  . TURNER'S SYNDROME 11/20/2006   Past Medical History:  Diagnosis Date  . Allergy    seasonal  . Cancer ([place])    skin cancer on nose  . Cataract   . Elevated liver function tests   . Hearing loss   . Hyperthyroidism   . OCD (obsessive compulsive disorder)   . Osteopenia   . Turner's syndrome    Past Surgical History:  Procedure Laterality Date  . BREAST ENHANCEMENT SURGERY  1988   bilateral  . COLONOSCOPY     2013  . maxum  2012   hearing surgery   Social History   Tobacco Use  . Smoking status: Never Smoker  . Smokeless tobacco: Never Used  Substance Use Topics  . Alcohol use: No    Alcohol/week: 0.0 oz  . Drug use: No   Family History  Problem Relation Age of  Onset  . Colon cancer Mother 39  . Diabetes Father   . Colon polyps Father 89  . Heart disease Father   . Prostate cancer Father   . Diabetes Maternal Grandmother   . Diabetes Paternal Grandmother   . Diabetes Paternal Grandfather   . Stomach cancer Neg Hx   . Esophageal cancer Neg Hx   . Rectal cancer Neg Hx    Allergies  Allergen Reactions  . Tetanus Toxoids     Hives (?)   Current Outpatient Medications on File Prior to Visit  Medication Sig Dispense Refill  . CALCIUM CITRATE-VITAMIN D PO Take 2 tablets by mouth daily.      . fexofenadine (ALLEGRA) 180 MG tablet Take 180 mg by mouth daily.    . fluticasone (FLONASE) 50 MCG/ACT nasal spray Place into both nostrils daily.    . Multiple Vitamin (MULTIVITAMIN) tablet Take 1 tablet by mouth daily.      . Vitamin D, Ergocalciferol, 2000 units CAPS Take 1 capsule by mouth daily.     No current facility-administered medications on file prior to visit.     Review of Systems  Constitutional: Negative for activity change, appetite change, fatigue, fever and unexpected weight change.  HENT: Positive for hearing loss. Negative for congestion, ear pain, rhinorrhea, sinus pressure and sore throat.   Eyes: Negative for pain, redness and visual disturbance.  Respiratory: Negative for cough, shortness of breath and wheezing.   Cardiovascular: Negative for chest pain and palpitations.  Gastrointestinal: Negative for abdominal pain, blood in stool, constipation and diarrhea.  Endocrine: Negative for polydipsia and polyuria.  Genitourinary: Negative for dysuria, frequency and urgency.  Musculoskeletal: Negative for arthralgias, back pain and myalgias.  Skin: Negative for pallor and rash.  Allergic/Immunologic: Negative for environmental allergies.  Neurological: Negative for dizziness, syncope and headaches.  Hematological: Negative for adenopathy. Does not bruise/bleed easily.  Psychiatric/Behavioral: Negative for decreased concentration and  dysphoric mood. The patient is nervous/anxious.        Objective:   Physical Exam  Constitutional: She appears well-developed and well-nourished. No distress.  overwt and well app   HENT:  Head: Normocephalic and atraumatic.  Right Ear: External ear normal.  Left Ear: External ear normal.  Mouth/Throat: Oropharynx is clear and moist.  Cochlear implant on the L  Hearing is improved   Eyes: Conjunctivae and EOM are normal. Pupils are equal, round, and reactive to  light. No scleral icterus.  Neck: Normal range of motion. Neck supple. No JVD present. Carotid bruit is not present. No thyromegaly present.  Cardiovascular: Normal rate, regular rhythm, normal heart sounds and intact distal pulses. Exam reveals no gallop.  Pulmonary/Chest: Effort normal and breath sounds normal. No respiratory distress. She has no wheezes. She exhibits no tenderness.  Abdominal: Soft. Bowel sounds are normal. She exhibits no distension, no abdominal bruit and no mass. There is no tenderness.  Genitourinary: No breast swelling, tenderness, discharge or bleeding.  Genitourinary Comments: Breast exam: No mass, nodules, thickening, tenderness, bulging, retraction, inflamation, nipple discharge or skin changes noted.  No axillary or clavicular LA.    Aware of bilat implant leaks   Musculoskeletal: Normal range of motion. She exhibits no edema or tenderness.  No kyphosis   Lymphadenopathy:    She has no cervical adenopathy.  Neurological: She is alert. She has normal reflexes. No cranial nerve deficit. She exhibits normal muscle tone. Coordination normal.  Skin: Skin is warm and dry. No rash noted. No erythema. No pallor.  Solar lentigines diffusely   fair  Psychiatric: She has a normal mood and affect.          Assessment & Plan:   Problem List Items Addressed This Visit      Endocrine   Hypothyroidism    Hypothyroidism  Pt has no clinical changes No change in energy level/ hair or skin/ edema and  no tremor Lab Results  Component Value Date   TSH 5.11 (H) 07/03/2017     Will increase levothy to 88 mcg daily  She is taking it in the right fashion      Relevant Medications   levothyroxine (SYNTHROID, LEVOTHROID) 88 MCG tablet   Other Relevant Orders   TSH     Nervous and Auditory   Hearing loss    Doing better now with cochlear implant         Musculoskeletal and Integument   Osteopenia    On ca and D No falls or fx Disc need for calcium/ vitamin D/ wt bearing exercise and bone density test every 2 y to monitor Disc safety/ fracture risk in detail    Pt wants to put off dexa one more year  Disc plan for exercise  She will be coming off of HRT as well         Genitourinary   TURNER'S SYNDROME    No changes Rev last echo        Other   Breast implant leak    Not bothersome to her No change in exam      Family history of colon cancer    Reassuring colonoscopy with 1 polyp 4/18 5 y recall      Obsessive-compulsive disorder    Well controlled with prozac  Has xanax prn for emergency anxiety (uses very rarely)      Relevant Medications   ALPRAZolam (XANAX) 0.5 MG tablet   FLUoxetine (PROZAC) 40 MG capsule   Routine general medical examination at a health care facility - Primary    Reviewed health habits including diet and exercise and skin cancer prevention Reviewed appropriate screening tests for age  Also reviewed health mt list, fam hx and immunization status , as well as social and family history   See HPI Labs rev Disc plan to start exercise Continue ca and D- will put off dexa another year She will schedule her own mammogram  Inc levothy to 88 mcg and  re check in 6 wk  Plans on stopping HRT due to age-disc expectations

## 2017-07-10 NOTE — Assessment & Plan Note (Signed)
Not bothersome to her No change in exam

## 2017-07-10 NOTE — Assessment & Plan Note (Signed)
Reassuring colonoscopy with 1 polyp 4/18 5 y recall

## 2017-07-24 ENCOUNTER — Encounter: Payer: Self-pay | Admitting: Family Medicine

## 2017-08-21 ENCOUNTER — Other Ambulatory Visit (INDEPENDENT_AMBULATORY_CARE_PROVIDER_SITE_OTHER): Payer: Self-pay

## 2017-08-21 DIAGNOSIS — E038 Other specified hypothyroidism: Secondary | ICD-10-CM

## 2017-08-21 LAB — TSH: TSH: 3.77 u[IU]/mL (ref 0.35–4.50)

## 2018-04-26 ENCOUNTER — Ambulatory Visit: Payer: Self-pay | Admitting: Family Medicine

## 2018-04-26 ENCOUNTER — Encounter: Payer: Self-pay | Admitting: Family Medicine

## 2018-04-26 VITALS — BP 116/62 | HR 74 | Temp 98.2°F | Ht 59.75 in | Wt 156.2 lb

## 2018-04-26 DIAGNOSIS — J069 Acute upper respiratory infection, unspecified: Secondary | ICD-10-CM

## 2018-04-26 DIAGNOSIS — H65112 Acute and subacute allergic otitis media (mucoid) (sanguinous) (serous), left ear: Secondary | ICD-10-CM

## 2018-04-26 MED ORDER — AMOXICILLIN 500 MG PO CAPS: 1000.0000 mg | ORAL_CAPSULE | Freq: Two times a day (BID) | ORAL | 0 refills | Status: DC

## 2018-04-26 NOTE — Progress Notes (Signed)
Dr. Frederico Hamman T. Yuval Nolet, MD, [facility] [place], [address] Phone: [phone] Fax: [phone]  04/26/2018  Patient: Debra Ford, MRN: [id number], DOB: 12/18/1955, 62 y.o.  Primary Physician:  Tower, Wynelle Fanny, MD   Chief Complaint  Patient presents with  . Ear Pain    Left  . Nasal Congestion   Subjective:   Debra Ford is a 62 y.o. very pleasant female patient who presents with the following:  4-5 days history of URI symptoms with congestion, mild coughing, nasal drainage without fever, chills, sputum production, n/v/d. Over the last day or so she has developed L sided ear pain.  She has a history of cochlear implant and a prior L ear surgery per her report, as well.   Past Medical History, Surgical History, Social History, Family History, Problem List, Medications, and Allergies have been reviewed and updated if relevant.  Patient Active Problem List   Diagnosis Date Noted  . Pre-operative examination 01/13/2017  . Breast implant leak 08/06/2015  . Encounter for routine gynecological examination 07/04/2015  . Estrogen deficiency 07/04/2015  . Routine general medical examination at a health care facility 06/25/2014  . Family history of colon cancer 04/21/2011  . Osteopenia 12/29/2006  . Hypothyroidism 11/30/2006  . Obsessive-compulsive disorder 11/20/2006  . Hearing loss 11/20/2006  . HEMORRHOIDS, INTERNAL 11/20/2006  . TURNER'S SYNDROME 11/20/2006    Past Medical History:  Diagnosis Date  . Allergy    seasonal  . Cancer ([place])    skin cancer on nose  . Cataract   . Elevated liver function tests   . Hearing loss   . Hyperthyroidism   . OCD (obsessive compulsive disorder)   . Osteopenia   . Turner's syndrome     Past Surgical History:  Procedure Laterality Date  . BREAST ENHANCEMENT SURGERY  1988   bilateral  . COLONOSCOPY     2013  . maxum  2012   hearing surgery    Social  History   Socioeconomic History  . Marital status: Married    Spouse name: Not on file  . Number of children: Not on file  . Years of education: Not on file  . Highest education level: Not on file  Occupational History  . Not on file  Social Needs  . Financial resource strain: Not on file  . Food insecurity:    Worry: Not on file    Inability: Not on file  . Transportation needs:    Medical: Not on file    Non-medical: Not on file  Tobacco Use  . Smoking status: Never Smoker  . Smokeless tobacco: Never Used  Substance and Sexual Activity  . Alcohol use: No    Alcohol/week: 0.0 standard drinks  . Drug use: No  . Sexual activity: Not on file  Lifestyle  . Physical activity:    Days per week: Not on file    Minutes per session: Not on file  . Stress: Not on file  Relationships  . Social connections:    Talks on phone: Not on file    Gets together: Not on file    Attends religious service: Not on file    Active member of club or organization: Not on file    Attends meetings of clubs or organizations: Not on file    Relationship status: Not on file  . Intimate partner violence:    Fear of current or ex partner: Not on  file    Emotionally abused: Not on file    Physically abused: Not on file    Forced sexual activity: Not on file  Other Topics Concern  . Not on file  Social History Narrative  . Not on file    Family History  Problem Relation Age of Onset  . Colon cancer Mother 27  . Diabetes Father   . Colon polyps Father 33  . Heart disease Father   . Prostate cancer Father   . Diabetes Maternal Grandmother   . Diabetes Paternal Grandmother   . Diabetes Paternal Grandfather   . Stomach cancer Neg Hx   . Esophageal cancer Neg Hx   . Rectal cancer Neg Hx     Allergies  Allergen Reactions  . Tetanus Toxoids     Hives (?)    Medication list reviewed and updated in full in [facility].  ROS: GEN: Acute illness details above GI: Tolerating PO  intake GU: maintaining adequate hydration and urination Pulm: No SOB Interactive and getting along well at home.  Otherwise, ROS is as per the HPI.  Objective:   BP 116/62   Pulse 74   Temp 98.2 F (36.8 C) (Oral)   Ht 4' 11.75" (1.518 m)   Wt 156 lb 4 oz (70.9 kg)   BMI 30.77 kg/m    Gen: WDWN, NAD; A & O x3, cooperative. Pleasant.Globally Non-toxic HEENT: Normocephalic and atraumatic. Throat clear, w/o exudate, R TM clear, L TM - bulging, red, poor landmarks. rhinnorhea.  MMM Frontal sinuses: NT Max sinuses: NT NECK: Anterior cervical  LAD is absent CV: RRR, No M/G/R, cap refill <2 sec PULM: Breathing comfortably in no respiratory distress. no wheezing, crackles, rhonchi EXT: No c/c/e PSYCH: Friendly, good eye contact MSK: Nml gait     Laboratory and Imaging Data:  Assessment and Plan:   Acute mucoid otitis media of left ear  URI, acute  Prior uri then LOM, supportive care + amox  Follow-up: No follow-ups on file.  Meds ordered this encounter  Medications  . amoxicillin (AMOXIL) 500 MG capsule    Sig: Take 2 capsules (1,000 mg total) by mouth 2 (two) times daily for 10 days.    Dispense:  40 capsule    Refill:  0   Signed,  Aeliana Spates T. Rhonda Vangieson, MD   Outpatient Encounter Medications as of 04/26/2018  Medication Sig  . ALPRAZolam (XANAX) 0.5 MG tablet Take 0.5-1 tablets (0.25-0.5 mg total) by mouth daily as needed for anxiety.  Marland Kitchen CALCIUM CITRATE-VITAMIN D PO Take 2 tablets by mouth daily.    . fexofenadine (ALLEGRA) 180 MG tablet Take 180 mg by mouth daily.  Marland Kitchen FLUoxetine (PROZAC) 40 MG capsule Take 1 capsule (40 mg total) by mouth daily.  . fluticasone (FLONASE) 50 MCG/ACT nasal spray Place into both nostrils daily.  Marland Kitchen levothyroxine (SYNTHROID, LEVOTHROID) 88 MCG tablet Take 1 tablet (88 mcg total) by mouth daily.  . Multiple Vitamin (MULTIVITAMIN) tablet Take 1 tablet by mouth daily.    . Vitamin D, Ergocalciferol, 2000 units CAPS Take 1 capsule by  mouth daily.  Marland Kitchen amoxicillin (AMOXIL) 500 MG capsule Take 2 capsules (1,000 mg total) by mouth 2 (two) times daily for 10 days.   No facility-administered encounter medications on file as of 04/26/2018.

## 2018-04-28 ENCOUNTER — Ambulatory Visit: Payer: Self-pay | Admitting: Family Medicine

## 2018-05-07 ENCOUNTER — Other Ambulatory Visit: Payer: Self-pay | Admitting: Family Medicine

## 2018-05-07 NOTE — Telephone Encounter (Signed)
Patient stopped by to ask for refill on Amoxicillin and make sure pharmacy sent over this request. Patient states she is having drainage from left ear now, having pain in the left ear. Drainage is new and pain did get better on Amoxicillin and pain is getting more intense now. No fever. No ringing in the ears or decreased hearing. Please review for Dr Lorelei Pont. Thank you.CB: [phone].

## 2018-05-07 NOTE — Telephone Encounter (Signed)
Electronic refill request. Amoxicillin Last office visit:   04/26/18 [place] Last Filled:   04/26/18 Please advise.

## 2018-05-07 NOTE — Telephone Encounter (Signed)
Please refill amox and schedule her with me for a re check next week Thanks

## 2018-05-07 NOTE — Telephone Encounter (Signed)
Left message for patient advising refill was sent in but to call back to schedule an appointment for next week

## 2018-05-10 NOTE — Telephone Encounter (Signed)
Appointment made for 05/14/2018

## 2018-05-14 ENCOUNTER — Ambulatory Visit: Payer: Self-pay | Admitting: Family Medicine

## 2018-05-14 ENCOUNTER — Encounter: Payer: Self-pay | Admitting: Family Medicine

## 2018-05-14 VITALS — BP 138/74 | HR 62 | Temp 97.2°F | Ht 59.75 in | Wt 155.2 lb

## 2018-05-14 DIAGNOSIS — H6592 Unspecified nonsuppurative otitis media, left ear: Secondary | ICD-10-CM

## 2018-05-14 NOTE — Progress Notes (Signed)
Subjective:    Patient ID: Debra Ford, female    DOB: 12-06-1955, 63 y.o.   MRN: [id number]  HPI Here for r echeck ear symptoms   Seen on 12/16 by Dr Lorelei Pont  Dx with acute mucoid OM of L ear with acute uri  Px amoxicillin 1000 mg bid   H/o cochlear implant   Just a little drainage last night  No pain   Not a lot of nasal symptoms /congestion is improved  No fever   Patient Active Problem List   Diagnosis Date Noted  . Pre-operative examination 01/13/2017  . Breast implant leak 08/06/2015  . Encounter for routine gynecological examination 07/04/2015  . Estrogen deficiency 07/04/2015  . Routine general medical examination at a health care facility 06/25/2014  . Family history of colon cancer 04/21/2011  . OME (otitis media with effusion), left 05/14/2009  . Osteopenia 12/29/2006  . Hypothyroidism 11/30/2006  . Obsessive-compulsive disorder 11/20/2006  . Hearing loss 11/20/2006  . HEMORRHOIDS, INTERNAL 11/20/2006  . TURNER'S SYNDROME 11/20/2006   Past Medical History:  Diagnosis Date  . Allergy    seasonal  . Cancer ([place])    skin cancer on nose  . Cataract   . Elevated liver function tests   . Hearing loss   . Hyperthyroidism   . OCD (obsessive compulsive disorder)   . Osteopenia   . Turner's syndrome    Past Surgical History:  Procedure Laterality Date  . BREAST ENHANCEMENT SURGERY  1988   bilateral  . COLONOSCOPY     2013  . maxum  2012   hearing surgery   Social History   Tobacco Use  . Smoking status: Never Smoker  . Smokeless tobacco: Never Used  Substance Use Topics  . Alcohol use: No    Alcohol/week: 0.0 standard drinks  . Drug use: No   Family History  Problem Relation Age of Onset  . Colon cancer Mother 73  . Diabetes Father   . Colon polyps Father 64  . Heart disease Father   . Prostate cancer Father   . Diabetes Maternal Grandmother   . Diabetes Paternal Grandmother   . Diabetes Paternal Grandfather   . Stomach cancer Neg  Hx   . Esophageal cancer Neg Hx   . Rectal cancer Neg Hx    Allergies  Allergen Reactions  . Tetanus Toxoids     Hives (?)   Current Outpatient Medications on File Prior to Visit  Medication Sig Dispense Refill  . ALPRAZolam (XANAX) 0.5 MG tablet Take 0.5-1 tablets (0.25-0.5 mg total) by mouth daily as needed for anxiety. 15 tablet 0  . amoxicillin (AMOXIL) 500 MG capsule TAKE 2 CAPSULES BY MOUTH TWICE DAILY FOR 10 DAYS. 40 capsule 0  . CALCIUM CITRATE-VITAMIN D PO Take 2 tablets by mouth daily.      . fexofenadine (ALLEGRA) 180 MG tablet Take 180 mg by mouth daily.    Marland Kitchen FLUoxetine (PROZAC) 40 MG capsule Take 1 capsule (40 mg total) by mouth daily. 90 capsule 3  . fluticasone (FLONASE) 50 MCG/ACT nasal spray Place into both nostrils daily.    Marland Kitchen levothyroxine (SYNTHROID, LEVOTHROID) 88 MCG tablet Take 1 tablet (88 mcg total) by mouth daily. 30 tablet 11  . Multiple Vitamin (MULTIVITAMIN) tablet Take 1 tablet by mouth daily.      . Vitamin D, Ergocalciferol, 2000 units CAPS Take 1 capsule by mouth daily.     No current facility-administered medications on file prior to visit.  Review of Systems  Constitutional: Negative for activity change, appetite change, fatigue, fever and unexpected weight change.  HENT: Positive for hearing loss. Negative for congestion, ear discharge, ear pain, rhinorrhea, sinus pressure and sore throat.   Eyes: Negative for pain, redness and visual disturbance.  Respiratory: Negative for cough, shortness of breath and wheezing.   Cardiovascular: Negative for chest pain and palpitations.  Gastrointestinal: Negative for abdominal pain, blood in stool, constipation and diarrhea.  Endocrine: Negative for polydipsia and polyuria.  Genitourinary: Negative for dysuria, frequency and urgency.  Musculoskeletal: Negative for arthralgias, back pain and myalgias.  Skin: Negative for pallor and rash.  Allergic/Immunologic: Negative for environmental allergies.    Neurological: Negative for dizziness, syncope and headaches.  Hematological: Negative for adenopathy. Does not bruise/bleed easily.  Psychiatric/Behavioral: Negative for decreased concentration and dysphoric mood. The patient is not nervous/anxious.        Objective:   Physical Exam Constitutional:      General: She is not in acute distress.    Appearance: Normal appearance. She is obese. She is not ill-appearing.  HENT:     Head: Normocephalic and atraumatic.     Right Ear: Ear canal and external ear normal. There is no impacted cerumen.     Left Ear: Tympanic membrane, ear canal and external ear normal. There is no impacted cerumen.     Ears:     Comments: Small effusion R TM -improved No erythema or bulging     Nose: Rhinorrhea present. No congestion.     Mouth/Throat:     Mouth: Mucous membranes are moist.     Pharynx: Oropharynx is clear. No posterior oropharyngeal erythema.  Eyes:     General: No scleral icterus.       Right eye: No discharge.        Left eye: No discharge.     Extraocular Movements: Extraocular movements intact.     Conjunctiva/sclera: Conjunctivae normal.     Pupils: Pupils are equal, round, and reactive to light.  Neck:     Musculoskeletal: Normal range of motion. No neck rigidity.  Cardiovascular:     Rate and Rhythm: Normal rate and regular rhythm.  Pulmonary:     Effort: Pulmonary effort is normal. No respiratory distress.     Breath sounds: Normal breath sounds. No wheezing or rales.  Lymphadenopathy:     Cervical: No cervical adenopathy.  Skin:    General: Skin is warm and dry.     Capillary Refill: Capillary refill takes less than 2 seconds.     Findings: No erythema or rash.  Neurological:     General: No focal deficit present.     Mental Status: She is alert.     Cranial Nerves: No cranial nerve deficit.  Psychiatric:        Mood and Affect: Mood normal.           Assessment & Plan:   Problem List Items Addressed This Visit       Nervous and Auditory   OME (otitis media with effusion), left - Primary    Resolved from OM tx on 12/16 Disc use of steroid ns if needed to prevent ETD inst to alert [place] if symptoms return

## 2018-05-14 NOTE — Assessment & Plan Note (Signed)
Resolved from OM tx on 12/16 Disc use of steroid ns if needed to prevent ETD inst to alert [place] if symptoms return

## 2018-05-14 NOTE — Patient Instructions (Signed)
Ear looks better  Take care of yourself   Continue the nasal spray to keep ear tube clear   If symptoms re start let us know

## 2018-06-30 ENCOUNTER — Other Ambulatory Visit: Payer: Self-pay | Admitting: Family Medicine

## 2018-07-01 ENCOUNTER — Other Ambulatory Visit: Payer: Self-pay | Admitting: Family Medicine

## 2018-07-11 ENCOUNTER — Telehealth: Payer: Self-pay | Admitting: Family Medicine

## 2018-07-11 DIAGNOSIS — Z0000 Encounter for general adult medical examination without abnormal findings: Secondary | ICD-10-CM

## 2018-07-11 DIAGNOSIS — E038 Other specified hypothyroidism: Secondary | ICD-10-CM

## 2018-07-11 NOTE — Telephone Encounter (Signed)
-----   Message from Ellamae Sia sent at 07/05/2018 11:16 AM EST ----- Regarding: Lab orders for Monday, 3.2.20 Patient is scheduled for CPX labs, please order future labs, Thanks , Karna Christmas

## 2018-07-12 ENCOUNTER — Other Ambulatory Visit (INDEPENDENT_AMBULATORY_CARE_PROVIDER_SITE_OTHER): Payer: Self-pay

## 2018-07-12 DIAGNOSIS — E038 Other specified hypothyroidism: Secondary | ICD-10-CM

## 2018-07-12 DIAGNOSIS — Z0000 Encounter for general adult medical examination without abnormal findings: Secondary | ICD-10-CM

## 2018-07-12 LAB — COMPREHENSIVE METABOLIC PANEL
ALBUMIN: 4.1 g/dL (ref 3.5–5.2)
ALK PHOS: 147 U/L — AB (ref 39–117)
ALT: 37 U/L — ABNORMAL HIGH (ref 0–35)
AST: 22 U/L (ref 0–37)
BUN: 13 mg/dL (ref 6–23)
CALCIUM: 9.2 mg/dL (ref 8.4–10.5)
CO2: 30 mEq/L (ref 19–32)
Chloride: 100 mEq/L (ref 96–112)
Creatinine, Ser: 0.69 mg/dL (ref 0.40–1.20)
GFR: 86.61 mL/min (ref >60.00)
Glucose, Bld: 91 mg/dL (ref 70–99)
POTASSIUM: 4.1 meq/L (ref 3.5–5.1)
Sodium: 137 mEq/L (ref 135–145)
TOTAL PROTEIN: 6.1 g/dL (ref 6.0–8.3)
Total Bilirubin: 0.8 mg/dL (ref 0.2–1.2)

## 2018-07-12 LAB — CBC WITH DIFFERENTIAL/PLATELET
BASOS ABS: 0 10*3/uL (ref 0.0–0.1)
Basophils Relative: 0.5 % (ref 0.0–3.0)
EOS PCT: 1.3 % (ref 0.0–5.0)
Eosinophils Absolute: 0.1 10*3/uL (ref 0.0–0.7)
HCT: 39 % (ref 36.0–46.0)
HEMOGLOBIN: 13.2 g/dL (ref 12.0–15.0)
Lymphocytes Relative: 26.8 % (ref 12.0–46.0)
Lymphs Abs: 2.1 10*3/uL (ref 0.7–4.0)
MCHC: 34 g/dL (ref 30.0–36.0)
MCV: 94.5 fl (ref 78.0–100.0)
MONOS PCT: 10 % (ref 3.0–12.0)
Monocytes Absolute: 0.8 10*3/uL (ref 0.1–1.0)
Neutro Abs: 4.9 10*3/uL (ref 1.4–7.7)
Neutrophils Relative %: 61.4 % (ref 43.0–77.0)
Platelets: 226 10*3/uL (ref 150.0–400.0)
RBC: 4.12 Mil/uL (ref 3.87–5.11)
RDW: 12.3 % (ref 11.5–15.5)
WBC: 7.9 10*3/uL (ref 4.0–10.5)

## 2018-07-12 LAB — LIPID PANEL
CHOLESTEROL: 177 mg/dL (ref 0–200)
HDL: 60.9 mg/dL (ref >39.00)
LDL Cholesterol: 88 mg/dL (ref 0–99)
NonHDL: 115.64
Total CHOL/HDL Ratio: 3
Triglycerides: 140 mg/dL (ref 0.0–149.0)
VLDL: 28 mg/dL (ref 0.0–40.0)

## 2018-07-12 LAB — TSH: TSH: 3.79 u[IU]/mL (ref 0.35–4.50)

## 2018-07-16 ENCOUNTER — Ambulatory Visit: Payer: Self-pay | Admitting: Family Medicine

## 2018-07-16 ENCOUNTER — Encounter: Payer: Self-pay | Admitting: Family Medicine

## 2018-07-16 ENCOUNTER — Other Ambulatory Visit (HOSPITAL_COMMUNITY)
Admission: RE | Admit: 2018-07-16 | Discharge: 2018-07-16 | Disposition: A | Discharge status: Home / Self Care | Payer: Self-pay | Source: Ambulatory Visit | Attending: Family Medicine | Admitting: Family Medicine

## 2018-07-16 VITALS — BP 128/68 | HR 65 | Temp 98.1°F | Ht 59.25 in | Wt 153.2 lb

## 2018-07-16 DIAGNOSIS — T8543XS Leakage of breast prosthesis and implant, sequela: Secondary | ICD-10-CM

## 2018-07-16 DIAGNOSIS — Z1231 Encounter for screening mammogram for malignant neoplasm of breast: Secondary | ICD-10-CM | POA: Insufficient documentation

## 2018-07-16 DIAGNOSIS — Z01419 Encounter for gynecological examination (general) (routine) without abnormal findings: Secondary | ICD-10-CM

## 2018-07-16 DIAGNOSIS — E038 Other specified hypothyroidism: Secondary | ICD-10-CM

## 2018-07-16 DIAGNOSIS — E2839 Other primary ovarian failure: Secondary | ICD-10-CM

## 2018-07-16 DIAGNOSIS — M8589 Other specified disorders of bone density and structure, multiple sites: Secondary | ICD-10-CM

## 2018-07-16 DIAGNOSIS — R748 Abnormal levels of other serum enzymes: Secondary | ICD-10-CM

## 2018-07-16 DIAGNOSIS — Q969 Turner's syndrome, unspecified: Secondary | ICD-10-CM

## 2018-07-16 DIAGNOSIS — Z0000 Encounter for general adult medical examination without abnormal findings: Secondary | ICD-10-CM

## 2018-07-16 MED ORDER — ALPRAZOLAM 0.5 MG PO TABS: 0.2500 mg | ORAL_TABLET | Freq: Every day | ORAL | 0 refills | Status: DC | PRN

## 2018-07-16 MED ORDER — LEVOTHYROXINE SODIUM 88 MCG PO TABS: 88.0000 ug | ORAL_TABLET | Freq: Every day | ORAL | 3 refills | Status: DC

## 2018-07-16 MED ORDER — FLUOXETINE HCL 40 MG PO CAPS: 40.0000 mg | ORAL_CAPSULE | Freq: Every day | ORAL | 3 refills | Status: DC

## 2018-07-16 NOTE — Progress Notes (Signed)
Subjective:    Patient ID: Debra Ford, female    DOB: 1955-07-14, 63 y.o.   MRN: [id number]  HPI Here for health maintenance exam and to review chronic medical problems   Feeling ok  Nothing new going    Wt Readings from Last 3 Encounters:  07/16/18 153 lb 4 oz (69.5 kg)  05/14/18 155 lb 4 oz (70.4 kg)  04/26/18 156 lb 4 oz (70.9 kg)  eats healthy most of the time  exercise makes an effort -hard to fit into her schedule / likes to walk  30.69 kg/m    Pap  neg with neg HPV screen 3/17  Will do today  She came of her HRT - doing ok  Some hair growth /face    Flu shot- had in sept   Doing well with cochlear implants  Her doctor is leaving and she will get a new one    Mammogram 3/19 (implants ruptured)- she goes to  Self breast exam   Colonoscopy 4/18 -adenoma 5 y recall Mother had colon cancer   dexa 3/17- wants to get this year  Referral done  No falls  No broken bones  Taking her calcium and vitamin D  Off HRT now  Osteopenia   Tetanus shot 2/14  Zoster status - had vaccine at [facility] (2y ago)- zostavax   Pt has Turner's syndrome Echo nl 4/17  No heart issues or sob   Hypothyroidism  Pt has no clinical changes No change in energy level/ hair or skin/ edema and no tremor Lab Results  Component Value Date   TSH 3.79 07/12/2018   doing well   Cholesterol Lab Results  Component Value Date   CHOL 177 07/12/2018   CHOL 165 07/03/2017   CHOL 179 07/02/2016   Lab Results  Component Value Date   HDL 60.90 07/12/2018   HDL 59.60 07/03/2017   HDL 57.80 07/02/2016   Lab Results  Component Value Date   LDLCALC 88 07/12/2018   LDLCALC 84 07/03/2017   LDLCALC 96 07/02/2016   Lab Results  Component Value Date   TRIG 140.0 07/12/2018   TRIG 109.0 07/03/2017   TRIG 126.0 07/02/2016   Lab Results  Component Value Date   CHOLHDL 3 07/12/2018   CHOLHDL 3 07/03/2017   CHOLHDL 3 07/02/2016   No results found for: LDLDIRECT  Very good    Lab Results  Component Value Date   ALT 37 (H) 07/12/2018   AST 22 07/12/2018   ALKPHOS 147 (H) 07/12/2018   BILITOT 0.8 07/12/2018   Alk phos goes up and down  No abdominal pain  No etoh/ acetaminophen  Lab Results  Component Value Date   WBC 7.9 07/12/2018   HGB 13.2 07/12/2018   HCT 39.0 07/12/2018   MCV 94.5 07/12/2018   PLT 226.0 07/12/2018   Lab Results  Component Value Date   CREATININE 0.69 07/12/2018   BUN 13 07/12/2018   NA 137 07/12/2018   K 4.1 07/12/2018   CL 100 07/12/2018   CO2 30 07/12/2018   Patient Active Problem List   Diagnosis Date Noted  . Screening mammogram, encounter for 07/16/2018  . Pre-operative examination 01/13/2017  . Breast implant leak 08/06/2015  . Encounter for routine gynecological examination 07/04/2015  . Estrogen deficiency 07/04/2015  . Routine general medical examination at a health care facility 06/25/2014  . Family history of colon cancer 04/21/2011  . Osteopenia 12/29/2006  . Hypothyroidism 11/30/2006  . Obsessive-compulsive disorder 11/20/2006  .  Hearing loss 11/20/2006  . HEMORRHOIDS, INTERNAL 11/20/2006  . TURNER'S SYNDROME 11/20/2006   Past Medical History:  Diagnosis Date  . Allergy    seasonal  . Cancer ([facility])    skin cancer on nose  . Cataract   . Elevated liver function tests   . Hearing loss   . Hyperthyroidism   . OCD (obsessive compulsive disorder)   . Osteopenia   . Turner's syndrome    Past Surgical History:  Procedure Laterality Date  . BREAST ENHANCEMENT SURGERY  1988   bilateral  . COLONOSCOPY     2013  . maxum  2012   hearing surgery   Social History   Tobacco Use  . Smoking status: Never Smoker  . Smokeless tobacco: Never Used  Substance Use Topics  . Alcohol use: No    Alcohol/week: 0.0 standard drinks  . Drug use: No   Family History  Problem Relation Age of Onset  . Colon cancer Mother 45  . Diabetes Father   . Colon polyps Father 10  . Heart disease Father   .  Prostate cancer Father   . Diabetes Maternal Grandmother   . Diabetes Paternal Grandmother   . Diabetes Paternal Grandfather   . Stomach cancer Neg Hx   . Esophageal cancer Neg Hx   . Rectal cancer Neg Hx    Allergies  Allergen Reactions  . Tetanus Toxoids     Hives (?)   Current Outpatient Medications on File Prior to Visit  Medication Sig Dispense Refill  . CALCIUM CITRATE-VITAMIN D PO Take 2 tablets by mouth daily.      . fexofenadine (ALLEGRA) 180 MG tablet Take 180 mg by mouth daily.    . fluticasone (FLONASE) 50 MCG/ACT nasal spray Place into both nostrils daily.    . Multiple Vitamin (MULTIVITAMIN) tablet Take 1 tablet by mouth daily.      . Vitamin D, Ergocalciferol, 2000 units CAPS Take 1 capsule by mouth daily.     No current facility-administered medications on file prior to visit.      Review of Systems  Constitutional: Negative for activity change, appetite change, fatigue, fever and unexpected weight change.  HENT: Positive for hearing loss. Negative for congestion, ear pain, rhinorrhea, sinus pressure and sore throat.   Eyes: Negative for pain, redness and visual disturbance.  Respiratory: Negative for cough, shortness of breath and wheezing.   Cardiovascular: Negative for chest pain and palpitations.  Gastrointestinal: Negative for abdominal pain, blood in stool, constipation and diarrhea.  Endocrine: Negative for polydipsia and polyuria.  Genitourinary: Negative for dysuria, frequency and urgency.  Musculoskeletal: Negative for arthralgias, back pain and myalgias.  Skin: Negative for pallor and rash.  Allergic/Immunologic: Negative for environmental allergies.  Neurological: Negative for dizziness, syncope and headaches.  Hematological: Negative for adenopathy. Does not bruise/bleed easily.  Psychiatric/Behavioral: Negative for decreased concentration and dysphoric mood. The patient is not nervous/anxious.        Objective:   Physical Exam Constitutional:       General: She is not in acute distress.    Appearance: Normal appearance. She is well-developed. She is obese. She is not ill-appearing.     Comments: Mild features of turner's syndrome   HENT:     Head: Normocephalic and atraumatic.     Right Ear: Tympanic membrane, ear canal and external ear normal.     Left Ear: Tympanic membrane, ear canal and external ear normal.     Ears:     Comments:  Hearing loss Cochlear implants noted     Nose: Nose normal.     Mouth/Throat:     Mouth: Mucous membranes are moist.     Pharynx: Oropharynx is clear.  Eyes:     General: No scleral icterus.    Conjunctiva/sclera: Conjunctivae normal.     Pupils: Pupils are equal, round, and reactive to light.  Neck:     Musculoskeletal: Normal range of motion and neck supple.     Thyroid: No thyromegaly.     Vascular: No carotid bruit or JVD.  Cardiovascular:     Rate and Rhythm: Normal rate and regular rhythm.     Heart sounds: Normal heart sounds. No gallop.   Pulmonary:     Effort: Pulmonary effort is normal. No respiratory distress.     Breath sounds: Normal breath sounds. No wheezing or rales.  Chest:     Chest wall: No tenderness.  Abdominal:     General: Bowel sounds are normal. There is no distension or abdominal bruit.     Palpations: Abdomen is soft. There is no mass.     Tenderness: There is no abdominal tenderness.  Genitourinary:    Comments: Breast exam: No mass, nodules, thickening, tenderness, bulging, retraction, inflamation, nipple discharge or skin changes noted.  No axillary or clavicular LA.    No change in status of ruptured implants -no tenderness or masses noted          Anus appears normal w/o hemorrhoids or masses     External genitalia : nl appearance and hair distribution/no lesions     Urethral meatus : nl size, no lesions or prolapse     Urethra: no masses, tenderness or scarring    Bladder : no masses or tenderness     Vagina: nl general appearance,  no discharge or  Lesions, no significant cystocele  or rectocele     Cervix: no lesions/ discharge or friability    Uterus: nl size, contour, position, and mobility (not fixed) , non tender    Adnexa : no masses, tenderness, enlargement or nodularity       Musculoskeletal: Normal range of motion.        General: No tenderness.  Lymphadenopathy:     Cervical: No cervical adenopathy.  Skin:    General: Skin is warm and dry.     Coloration: Skin is not pale.     Findings: No erythema or rash.  Neurological:     General: No focal deficit present.     Mental Status: She is alert.     Cranial Nerves: No cranial nerve deficit.     Motor: No abnormal muscle tone.     Coordination: Coordination normal.     Deep Tendon Reflexes: Reflexes are normal and symmetric. Reflexes normal.  Psychiatric:        Mood and Affect: Mood normal.           Assessment & Plan:   Problem List Items Addressed This Visit      Endocrine   Hypothyroidism    Hypothyroidism  Pt has no clinical changes No change in energy level/ hair or skin/ edema and no tremor Lab Results  Component Value Date   TSH 3.79 07/12/2018          Relevant Medications   levothyroxine (SYNTHROID, LEVOTHROID) 88 MCG tablet     Musculoskeletal and Integument   Osteopenia    Due for dexa  No falls or fractures  Now off HRT  On  ca and nD        Genitourinary   TURNER'S SYNDROME    No changes Off HRT  No cardiac symptoms Continues thyroid supplementation         Other   Routine general medical examination at a health care facility - Primary    Reviewed health habits including diet and exercise and skin cancer prevention Reviewed appropriate screening tests for age  Also reviewed health mt list, fam hx and immunization status , as well as social and family history   See HPI Labs reviewed  Enc healthy exercise  dexa and mammogram referral today        Encounter for routine gynecological  examination    No abn or complaints  Pap done      Relevant Orders   Cytology - PAP([facility])   Estrogen deficiency   Relevant Orders   DG Bone Density   Breast implant leak    No change in exam No symptoms  Pt does not want to take further action       Screening mammogram, encounter for    Ref done Aware of ruptured implants- no c/o or symptoms       Relevant Orders   MM 3D SCREEN BREAST BILATERAL

## 2018-07-16 NOTE — Patient Instructions (Addendum)
Keep working on [employer] and exercise   Stop at check out to schedule mammogram and dexa  Continue your calcium and vitamin D  Try to fit in exercise when you can   We will re check liver tests in 1-2 months   Take care of yourself !

## 2018-07-18 DIAGNOSIS — R748 Abnormal levels of other serum enzymes: Secondary | ICD-10-CM | POA: Insufficient documentation

## 2018-07-18 NOTE — Assessment & Plan Note (Signed)
No change in exam No symptoms  Pt does not want to take further action

## 2018-07-18 NOTE — Assessment & Plan Note (Signed)
No changes Off HRT  No cardiac symptoms Continues thyroid supplementation

## 2018-07-18 NOTE — Assessment & Plan Note (Signed)
Ref done Aware of ruptured implants- no c/o or symptoms

## 2018-07-18 NOTE — Assessment & Plan Note (Signed)
Reviewed health habits including diet and exercise and skin cancer prevention Reviewed appropriate screening tests for age  Also reviewed health mt list, fam hx and immunization status , as well as social and family history   See HPI Labs reviewed  Enc healthy exercise  dexa and mammogram referral today

## 2018-07-18 NOTE — Assessment & Plan Note (Addendum)
No abn or complaints  Pap done

## 2018-07-18 NOTE — Assessment & Plan Note (Signed)
Due for dexa  No falls or fractures  Now off HRT  On ca and nD

## 2018-07-18 NOTE — Assessment & Plan Note (Signed)
Mild with ALT of 37 and alk phos of 147 (h/o intermittent elevated alk phos) No liver toxic meds or etoh Will plan to re check in a month

## 2018-07-18 NOTE — Assessment & Plan Note (Signed)
Hypothyroidism  Pt has no clinical changes No change in energy level/ hair or skin/ edema and no tremor Lab Results  Component Value Date   TSH 3.79 07/12/2018

## 2018-07-20 LAB — CYTOLOGY - PAP
Diagnosis: NEGATIVE
HPV: NOT DETECTED

## 2018-09-16 ENCOUNTER — Telehealth: Payer: Self-pay

## 2018-09-16 NOTE — Telephone Encounter (Signed)
Left detailed VM w COVID screen and curbside info   

## 2018-09-20 ENCOUNTER — Other Ambulatory Visit (INDEPENDENT_AMBULATORY_CARE_PROVIDER_SITE_OTHER): Payer: Self-pay

## 2018-09-20 DIAGNOSIS — R748 Abnormal levels of other serum enzymes: Secondary | ICD-10-CM

## 2018-09-20 LAB — HEPATIC FUNCTION PANEL
ALT: 43 U/L — ABNORMAL HIGH (ref 0–35)
AST: 24 U/L (ref 0–37)
Albumin: 4.2 g/dL (ref 3.5–5.2)
Alkaline Phosphatase: 174 U/L — ABNORMAL HIGH (ref 39–117)
Bilirubin, Direct: 0.1 mg/dL (ref 0.0–0.3)
Total Bilirubin: 0.9 mg/dL (ref 0.2–1.2)
Total Protein: 6.5 g/dL (ref 6.0–8.3)

## 2018-09-20 NOTE — Addendum Note (Signed)
Addended by: Ellamae Sia on: 09/20/2018 09:34 AM   Modules accepted: Orders

## 2018-09-22 LAB — ALKALINE PHOSPHATASE, ISOENZYMES
Alkaline Phosphatase: 191 IU/L — ABNORMAL HIGH (ref 39–117)
BONE FRACTION: 18 % (ref 14–68)
INTESTINAL FRAC.: 1 % (ref 0–18)
LIVER FRACTION: 81 % (ref 18–85)

## 2018-09-23 ENCOUNTER — Telehealth: Payer: Self-pay | Admitting: Family Medicine

## 2018-09-23 DIAGNOSIS — R748 Abnormal levels of other serum enzymes: Secondary | ICD-10-CM

## 2018-09-23 NOTE — Telephone Encounter (Signed)
I spoke with patient and she said she is not having any abdominal pain or nausea, she feels fine. She did view the results on mychart. I set up for [facility] labs, orders are needed.

## 2018-09-24 NOTE — Telephone Encounter (Signed)
Orders done

## 2018-09-24 NOTE — Telephone Encounter (Signed)
Debra Ford answered Dr. Marliss Coots question on her labs, will route to PCP

## 2018-09-24 NOTE — Addendum Note (Signed)
Addended by: Loura Pardon A on: 09/24/2018 09:52 AM   Modules accepted: Orders

## 2018-09-27 ENCOUNTER — Other Ambulatory Visit (INDEPENDENT_AMBULATORY_CARE_PROVIDER_SITE_OTHER): Payer: Self-pay

## 2018-09-27 DIAGNOSIS — R748 Abnormal levels of other serum enzymes: Secondary | ICD-10-CM

## 2018-09-27 LAB — GAMMA GT: GGT: 139 U/L — ABNORMAL HIGH (ref 7–51)

## 2018-09-29 LAB — NUCLEOTIDASE, 5', BLOOD: 5-Nucleotidase: 7 U/L (ref 0–10)

## 2018-10-05 ENCOUNTER — Telehealth: Payer: Self-pay | Admitting: Family Medicine

## 2018-10-05 DIAGNOSIS — R748 Abnormal levels of other serum enzymes: Secondary | ICD-10-CM

## 2018-10-05 NOTE — Telephone Encounter (Signed)
Done Will route to [facility]

## 2018-10-05 NOTE — Telephone Encounter (Signed)
[place] scheduled and patient is aware.

## 2018-10-05 NOTE — Telephone Encounter (Signed)
-----   Message from Debra Ford, [place] sent at 10/05/2018  9:15 AM EDT ----- Pt called back and agrees with [place] order. Please put order in and pt aware our [facility] will call to schedule appt

## 2018-10-06 ENCOUNTER — Ambulatory Visit
Admission: RE | Admit: 2018-10-06 | Discharge: 2018-10-06 | Disposition: A | Discharge status: Home / Self Care | Payer: No Typology Code available for payment source | Source: Ambulatory Visit | Attending: Family Medicine | Admitting: Family Medicine

## 2018-10-06 DIAGNOSIS — R748 Abnormal levels of other serum enzymes: Secondary | ICD-10-CM

## 2018-10-13 ENCOUNTER — Telehealth: Payer: Self-pay | Admitting: Family Medicine

## 2018-10-13 DIAGNOSIS — R748 Abnormal levels of other serum enzymes: Secondary | ICD-10-CM

## 2018-10-13 DIAGNOSIS — E2839 Other primary ovarian failure: Secondary | ICD-10-CM

## 2018-10-13 NOTE — Telephone Encounter (Signed)
I left a message on patient's voice mail to call back and schedule lab work in 6 months.

## 2018-10-13 NOTE — Addendum Note (Signed)
Addended by: Loura Pardon A on: 10/13/2018 11:53 AM   Modules accepted: Orders

## 2018-10-13 NOTE — Telephone Encounter (Signed)
Patient called today to schedule lab work. She stated she was suppose to have a liver function test done in 6 weeks. We did not see any orders in for this and wanted to verify.  She wanted Dr Glori Bickers to know she did have her Ultrasound done in [place]  She also wanted an order put in for her mammogram  And Dexa Scan to be done at [place] PHONE- [phone]

## 2018-10-13 NOTE — Telephone Encounter (Signed)
It's 6 months labs not 6 week labs but Dr. Glori Bickers please review orders pt is also requesting

## 2018-10-13 NOTE — Telephone Encounter (Signed)
I ordered dexa and will route to [facility]  Also ordered her liver labs for 6 mo

## 2018-12-03 ENCOUNTER — Telehealth: Payer: Self-pay | Admitting: Family Medicine

## 2018-12-03 DIAGNOSIS — H9193 Unspecified hearing loss, bilateral: Secondary | ICD-10-CM

## 2018-12-03 NOTE — Telephone Encounter (Signed)
Patient called today requesting a referral.  She would like one sent to [place] at [facility] located in [place]   C/B # [phone]

## 2018-12-03 NOTE — Telephone Encounter (Signed)
[facility] hearing  Fax [phone]

## 2018-12-03 NOTE — Telephone Encounter (Signed)
I assume that is audiology ? If I am wrong and it is ENT let me know please   I will do the referral and route to [facility]

## 2018-12-30 ENCOUNTER — Ambulatory Visit
Admission: RE | Admit: 2018-12-30 | Discharge: 2018-12-30 | Disposition: A | Discharge status: Home / Self Care | Payer: Self-pay | Source: Ambulatory Visit | Attending: Family Medicine | Admitting: Family Medicine

## 2018-12-30 ENCOUNTER — Ambulatory Visit
Admission: RE | Admit: 2018-12-30 | Discharge: 2018-12-30 | Disposition: A | Discharge status: Home / Self Care | Payer: No Typology Code available for payment source | Source: Ambulatory Visit | Attending: Family Medicine | Admitting: Family Medicine

## 2018-12-30 ENCOUNTER — Other Ambulatory Visit: Payer: Self-pay

## 2018-12-30 DIAGNOSIS — E2839 Other primary ovarian failure: Secondary | ICD-10-CM

## 2018-12-30 DIAGNOSIS — Z1231 Encounter for screening mammogram for malignant neoplasm of breast: Secondary | ICD-10-CM

## 2018-12-31 ENCOUNTER — Encounter: Payer: Self-pay | Admitting: Family Medicine

## 2019-01-03 ENCOUNTER — Ambulatory Visit (INDEPENDENT_AMBULATORY_CARE_PROVIDER_SITE_OTHER): Payer: Self-pay | Admitting: Family Medicine

## 2019-01-03 ENCOUNTER — Other Ambulatory Visit: Payer: Self-pay

## 2019-01-03 ENCOUNTER — Encounter: Payer: Self-pay | Admitting: Family Medicine

## 2019-01-03 VITALS — BP 124/68 | HR 83 | Temp 98.2°F | Ht 59.25 in | Wt 156.1 lb

## 2019-01-03 DIAGNOSIS — M810 Age-related osteoporosis without current pathological fracture: Secondary | ICD-10-CM

## 2019-01-03 MED ORDER — IBANDRONATE SODIUM 150 MG PO TABS: 150.0000 mg | ORAL_TABLET | ORAL | 3 refills | Status: DC

## 2019-01-03 NOTE — Patient Instructions (Addendum)
Please start some exercise  -it is important for bones   Stay on your calcium and vitamin D  Start boniva again - we will give you the once monthly dose If insurance will not cover this- we can switch to weekly fosamax (alendronate) Plan to stay on it for 5 years  If any side effects please let us know   We can do another bone density test in 2 years  Let us know if you need anything else

## 2019-01-03 NOTE — Assessment & Plan Note (Signed)
Disc opt for tx  Pt wants to try boniva again  Px the once monthly dose-if not affordable will px alendronate instead  Anticipate 5 y course No side eff previously  Continue ca and D tsh is therapeutic  Strongly enc exercise  Also fall prevention  Re check dexa in 2 y

## 2019-01-03 NOTE — Progress Notes (Signed)
Subjective:    Patient ID: Debra Ford, female    DOB: 03/08/1956, 63 y.o.   MRN: [id number]  HPI Here to discuss dexa results   Done 12/30/18 Study Result  EXAM: DUAL X-RAY ABSORPTIOMETRY (DXA) FOR BONE MINERAL DENSITY  IMPRESSION: Referring Physician:  [place] Your patient completed a BMD test using Lunar IDXA DXA system ( analysis version: 16 ) manufactured by [employer]. Technologist: CG  PATIENT: Name: Debra Ford, Debra Ford Patient ID: [id number] Birth Date: [birth year]  Height:     60.0 in. Sex: Female Measured: 12/30/2018  Weight:     154.8 lbs. Indications: Caucasian, Estrogen Deficient, Levothyroxine, Postmenopausal, Prozac Fractures: None Treatments: Calcium (E943.0), Vitamin D (E933.5)  ASSESSMENT: The BMD measured at Femur Neck Left is 0.680 g/cm2 with a T-score of -2.6.  This patient is considered OSTEOPOROTIC according to [facility]) criteria. The scan quality is good.  Lumbar spine was not utilized due to advanced degenerative changes.  Site Region Measured Date Measured Age YA T-score BMD Significant CHANGE  DualFemur Neck Left 12/30/2018 61.0 -2.6 0.680 g/cm2  DualFemur Total Mean 12/30/2018 61.0 -2.0 0.760 g/cm2  Left Forearm Radius 33% 12/30/2018 61.0 -0.8 0.814 g/cm2    Supplements  Taking vit D and calcium   Exercise -not doing any   She took boniva years ago  Took for 5 y Bone density did improve with it  No side effects    Falls=none  Fractures=none   She is hypothyroid Lab Results  Component Value Date   TSH 3.79 07/12/2018    Last vit D level 48 in 2015  Having trouble with her hearing and device today  Has f/u at [facility] -had trouble getting in   Patient Active Problem List   Diagnosis Date Noted  . Elevated liver enzymes 07/18/2018  . Screening mammogram, encounter for 07/16/2018  . Pre-operative examination 01/13/2017  . Breast implant leak 08/06/2015  . Encounter for routine  gynecological examination 07/04/2015  . Estrogen deficiency 07/04/2015  . Routine general medical examination at a health care facility 06/25/2014  . Family history of colon cancer 04/21/2011  . Osteoporosis 12/29/2006  . Hypothyroidism 11/30/2006  . Obsessive-compulsive disorder 11/20/2006  . Hearing loss 11/20/2006  . HEMORRHOIDS, INTERNAL 11/20/2006  . TURNER'S SYNDROME 11/20/2006   Past Medical History:  Diagnosis Date  . Allergy    seasonal  . Cancer ([place])    skin cancer on nose  . Cataract   . Elevated liver function tests   . Hearing loss   . Hyperthyroidism   . OCD (obsessive compulsive disorder)   . Osteopenia   . Turner's syndrome    Past Surgical History:  Procedure Laterality Date  . AUGMENTATION MAMMAPLASTY    . BREAST ENHANCEMENT SURGERY  1988   bilateral  . COLONOSCOPY     2013  . maxum  2012   hearing surgery   Social History   Tobacco Use  . Smoking status: Never Smoker  . Smokeless tobacco: Never Used  Substance Use Topics  . Alcohol use: No    Alcohol/week: 0.0 standard drinks  . Drug use: No   Family History  Problem Relation Age of Onset  . Colon cancer Mother 6  . Diabetes Father   . Colon polyps Father 69  . Heart disease Father   . Prostate cancer Father   . Diabetes Maternal Grandmother   . Diabetes Paternal Grandmother   . Diabetes Paternal Grandfather   . Stomach cancer Neg  Hx   . Esophageal cancer Neg Hx   . Rectal cancer Neg Hx   . Breast cancer Neg Hx    Allergies  Allergen Reactions  . Tetanus Toxoids     Hives (?)   Current Outpatient Medications on File Prior to Visit  Medication Sig Dispense Refill  . ALPRAZolam (XANAX) 0.5 MG tablet Take 0.5-1 tablets (0.25-0.5 mg total) by mouth daily as needed for anxiety. 15 tablet 0  . CALCIUM CITRATE-VITAMIN D PO Take 2 tablets by mouth daily.      . fexofenadine (ALLEGRA) 180 MG tablet Take 180 mg by mouth daily.    Marland Kitchen FLUoxetine (PROZAC) 40 MG capsule Take 1 capsule (40 mg  total) by mouth daily. 90 capsule 3  . fluticasone (FLONASE) 50 MCG/ACT nasal spray Place into both nostrils daily.    Marland Kitchen levothyroxine (SYNTHROID, LEVOTHROID) 88 MCG tablet Take 1 tablet (88 mcg total) by mouth daily. 90 tablet 3  . Multiple Vitamin (MULTIVITAMIN) tablet Take 1 tablet by mouth daily.      . Vitamin D, Ergocalciferol, 2000 units CAPS Take 1 capsule by mouth daily.     No current facility-administered medications on file prior to visit.     Review of Systems  Constitutional: Negative for activity change, appetite change, fatigue, fever and unexpected weight change.  HENT: Negative for congestion, ear pain, rhinorrhea, sinus pressure and sore throat.   Eyes: Negative for pain, redness and visual disturbance.  Respiratory: Negative for cough, shortness of breath and wheezing.   Cardiovascular: Negative for chest pain and palpitations.  Gastrointestinal: Negative for abdominal pain, blood in stool, constipation and diarrhea.  Endocrine: Negative for polydipsia and polyuria.  Genitourinary: Negative for dysuria, frequency and urgency.  Musculoskeletal: Negative for arthralgias, back pain and myalgias.  Skin: Negative for pallor and rash.  Allergic/Immunologic: Negative for environmental allergies.  Neurological: Negative for dizziness, syncope and headaches.  Hematological: Negative for adenopathy. Does not bruise/bleed easily.  Psychiatric/Behavioral: Negative for decreased concentration and dysphoric mood. The patient is not nervous/anxious.        Objective:   Physical Exam Constitutional:      General: She is not in acute distress.    Appearance: Normal appearance. She is obese. She is not ill-appearing.  HENT:     Head: Normocephalic and atraumatic.  Eyes:     General: No scleral icterus.    Extraocular Movements: Extraocular movements intact.     Conjunctiva/sclera: Conjunctivae normal.     Pupils: Pupils are equal, round, and reactive to light.  Neck:      Musculoskeletal: Normal range of motion and neck supple. No neck rigidity or muscular tenderness.  Cardiovascular:     Rate and Rhythm: Normal rate and regular rhythm.     Heart sounds: Normal heart sounds.  Pulmonary:     Effort: Pulmonary effort is normal.     Breath sounds: Normal breath sounds.  Musculoskeletal:     Right lower leg: No edema.     Left lower leg: No edema.     Comments: No kyphosis  Small stature noted  Lymphadenopathy:     Cervical: No cervical adenopathy.  Skin:    General: Skin is warm and dry.     Coloration: Skin is not pale.     Findings: No erythema.  Neurological:     Mental Status: She is alert.  Psychiatric:        Mood and Affect: Mood normal.  Assessment & Plan:   Problem List Items Addressed This Visit      Musculoskeletal and Integument   Osteoporosis - Primary    Disc opt for tx  Pt wants to try boniva again  Px the once monthly dose-if not affordable will px alendronate instead  Anticipate 5 y course No side eff previously  Continue ca and D tsh is therapeutic  Strongly enc exercise  Also fall prevention  Re check dexa in 2 y      Relevant Medications   ibandronate (BONIVA) 150 MG tablet

## 2019-04-13 ENCOUNTER — Telehealth: Payer: Self-pay

## 2019-04-13 NOTE — Telephone Encounter (Signed)
LVM to call clinic, needs COVID screen and back lab info 12.2.2020 TLJ

## 2019-04-15 ENCOUNTER — Other Ambulatory Visit: Payer: Self-pay

## 2019-04-15 ENCOUNTER — Other Ambulatory Visit (INDEPENDENT_AMBULATORY_CARE_PROVIDER_SITE_OTHER): Payer: Self-pay

## 2019-04-15 DIAGNOSIS — R748 Abnormal levels of other serum enzymes: Secondary | ICD-10-CM

## 2019-04-15 LAB — HEPATIC FUNCTION PANEL
ALT: 29 U/L (ref 0–35)
AST: 20 U/L (ref 0–37)
Albumin: 4.2 g/dL (ref 3.5–5.2)
Alkaline Phosphatase: 138 U/L — ABNORMAL HIGH (ref 39–117)
Bilirubin, Direct: 0.1 mg/dL (ref 0.0–0.3)
Total Bilirubin: 0.8 mg/dL (ref 0.2–1.2)
Total Protein: 6.3 g/dL (ref 6.0–8.3)

## 2019-07-13 ENCOUNTER — Telehealth: Payer: Self-pay

## 2019-07-13 NOTE — Telephone Encounter (Signed)
LVM to call clinic, pt needs COVID screen, front door and back lab info 3.3.2021 TLJ

## 2019-07-14 ENCOUNTER — Telehealth: Payer: Self-pay | Admitting: Family Medicine

## 2019-07-14 DIAGNOSIS — Z0000 Encounter for general adult medical examination without abnormal findings: Secondary | ICD-10-CM

## 2019-07-14 DIAGNOSIS — E038 Other specified hypothyroidism: Secondary | ICD-10-CM

## 2019-07-14 DIAGNOSIS — M810 Age-related osteoporosis without current pathological fracture: Secondary | ICD-10-CM

## 2019-07-14 NOTE — Telephone Encounter (Signed)
-----   Message from Ellamae Sia sent at 07/05/2019  9:55 AM EST ----- Regarding: Lab orders for Fridday, 3.5.21 Patient is scheduled for CPX labs, please order future labs, Thanks , Karna Christmas

## 2019-07-15 ENCOUNTER — Other Ambulatory Visit: Payer: Self-pay

## 2019-07-15 ENCOUNTER — Telehealth: Payer: Self-pay

## 2019-07-15 NOTE — Telephone Encounter (Addendum)
Pt had lab apt today. On way into the back of the building, lab entrance, the pt tripped on the  concrete sidewalk and hit her face on the ground. Incident was not witnessed but pt was found sitting on a nearby bench by another pt coming in for labs. The other pt advised immediately that pt was hurt and needed help. This nurse went to the pt and assessment revealed pt was bleeding from left brow and had abrasions on both palms. Pt denied any pain but did report stinging at the left brow and both palms due to abrasions from catching herself during the fall. Gauze was placed on brow and pressure was applied until bleeding stopped. Pt denied any headache, dizziness, or unconsciousness after the fall. Pt reported fall happened right before help came. Pt was escorted to a procedure room and vital signs were taken, 136/78, 97.6 temp, 77 HR, 99% O2 sat. PCP came and performed an assessment. Bleeding had stopped and provider placed steri-strips over the cut. Pt was alert and oriented and denied any other pain. Provider educated pt on when to f/u and then released the pt. Provider advised office would f/u with her this afternoon. Provider requested pt's labs be drawn on another day. Pt requested she call her husband so he could pick her up. Pt's husband came and escorted the pt to his vehicle.   Contacted pt by phone at 1330 to assess. Pt denied any more bleeding but did report soreness on left side of abdomen due to fall. Pt would not rate as pain and only said "it is a little sore". Pt reports steri-strips were still in place. Pt denied any lightheadedness, headache, nausea or vomiting or any other injuries after leaving clinic. When questioned about how fall occurred pt stated" I didn't pick up my shoes very well". Pt denies anything in her pathway that caused the fall.  Educated pt to go to the ER if she developed any headache, dizziness/lightheadedness, persistent bleeding that cannot be stopped or nausea or vomiting.  Pt verbalized understanding. Pt's chart was updated to reflect pt is a fall risk. Sidewalk where fall occurred was inspected and no obstructions were observed.  Fall risk assessment done but not updated on storyboard. Will need to be done  At next office visit on 3/9

## 2019-07-15 NOTE — Telephone Encounter (Signed)
Thanks so much! If someone would check in with her on Monday that would be great.

## 2019-07-18 NOTE — Telephone Encounter (Signed)
Left VM requesting pt to call us back

## 2019-07-19 ENCOUNTER — Encounter: Payer: No Typology Code available for payment source | Admitting: Family Medicine

## 2019-07-19 ENCOUNTER — Other Ambulatory Visit (INDEPENDENT_AMBULATORY_CARE_PROVIDER_SITE_OTHER): Payer: Self-pay

## 2019-07-19 ENCOUNTER — Other Ambulatory Visit: Payer: Self-pay

## 2019-07-19 DIAGNOSIS — Z0000 Encounter for general adult medical examination without abnormal findings: Secondary | ICD-10-CM

## 2019-07-19 DIAGNOSIS — M810 Age-related osteoporosis without current pathological fracture: Secondary | ICD-10-CM

## 2019-07-19 DIAGNOSIS — E038 Other specified hypothyroidism: Secondary | ICD-10-CM

## 2019-07-19 LAB — COMPREHENSIVE METABOLIC PANEL
ALT: 66 U/L — ABNORMAL HIGH (ref 0–35)
AST: 38 U/L — ABNORMAL HIGH (ref 0–37)
Albumin: 4.1 g/dL (ref 3.5–5.2)
Alkaline Phosphatase: 206 U/L — ABNORMAL HIGH (ref 39–117)
BUN: 12 mg/dL (ref 6–23)
CO2: 30 mEq/L (ref 19–32)
Calcium: 9.3 mg/dL (ref 8.4–10.5)
Chloride: 99 mEq/L (ref 96–112)
Creatinine, Ser: 0.72 mg/dL (ref 0.40–1.20)
GFR: 82.18 mL/min (ref >60.00)
Glucose, Bld: 94 mg/dL (ref 70–99)
Potassium: 4.2 mEq/L (ref 3.5–5.1)
Sodium: 136 mEq/L (ref 135–145)
Total Bilirubin: 0.8 mg/dL (ref 0.2–1.2)
Total Protein: 6.6 g/dL (ref 6.0–8.3)

## 2019-07-19 LAB — CBC WITH DIFFERENTIAL/PLATELET
Basophils Absolute: 0.1 10*3/uL (ref 0.0–0.1)
Basophils Relative: 0.5 % (ref 0.0–3.0)
Eosinophils Absolute: 0.1 10*3/uL (ref 0.0–0.7)
Eosinophils Relative: 1.1 % (ref 0.0–5.0)
HCT: 39.8 % (ref 36.0–46.0)
Hemoglobin: 13.3 g/dL (ref 12.0–15.0)
Lymphocytes Relative: 18.3 % (ref 12.0–46.0)
Lymphs Abs: 2 10*3/uL (ref 0.7–4.0)
MCHC: 33.4 g/dL (ref 30.0–36.0)
MCV: 96 fl (ref 78.0–100.0)
Monocytes Absolute: 1.1 10*3/uL — ABNORMAL HIGH (ref 0.1–1.0)
Monocytes Relative: 10.5 % (ref 3.0–12.0)
Neutro Abs: 7.6 10*3/uL (ref 1.4–7.7)
Neutrophils Relative %: 69.6 % (ref 43.0–77.0)
Platelets: 252 10*3/uL (ref 150.0–400.0)
RBC: 4.14 Mil/uL (ref 3.87–5.11)
RDW: 12.8 % (ref 11.5–15.5)
WBC: 10.9 10*3/uL — ABNORMAL HIGH (ref 4.0–10.5)

## 2019-07-19 LAB — LIPID PANEL
Cholesterol: 181 mg/dL (ref 0–200)
HDL: 72.2 mg/dL (ref >39.00)
LDL Cholesterol: 85 mg/dL (ref 0–99)
NonHDL: 109.15
Total CHOL/HDL Ratio: 3
Triglycerides: 121 mg/dL (ref 0.0–149.0)
VLDL: 24.2 mg/dL (ref 0.0–40.0)

## 2019-07-19 LAB — TSH: TSH: 4.8 u[IU]/mL — ABNORMAL HIGH (ref 0.35–4.50)

## 2019-07-19 LAB — VITAMIN D 25 HYDROXY (VIT D DEFICIENCY, FRACTURES): VITD: 54.1 ng/mL (ref 30.00–100.00)

## 2019-07-19 NOTE — Telephone Encounter (Signed)
Pt came in for her lab appt today. I spoke directly with her she said she is doing fine after the fall. No dizziness, no HA, no trouble focusing. She said the only complaint is she is a little sore on her upper left side of her chest where she hit the ground directly. Pt said the pain is mild and to be expected after a fall. Pt said overall she is stable and she thanked Dr. Glori Bickers for checking on her

## 2019-07-21 ENCOUNTER — Encounter: Payer: Self-pay | Admitting: Family Medicine

## 2019-08-01 ENCOUNTER — Other Ambulatory Visit: Payer: Self-pay | Admitting: Family Medicine

## 2019-08-03 ENCOUNTER — Encounter: Payer: Self-pay | Admitting: Family Medicine

## 2019-08-17 ENCOUNTER — Encounter: Payer: Self-pay | Admitting: Family Medicine

## 2019-09-20 ENCOUNTER — Other Ambulatory Visit: Payer: Self-pay

## 2019-09-20 ENCOUNTER — Ambulatory Visit: Payer: Self-pay | Admitting: Family Medicine

## 2019-09-20 ENCOUNTER — Encounter: Payer: Self-pay | Admitting: Family Medicine

## 2019-09-20 VITALS — BP 128/78 | HR 87 | Temp 97.8°F | Ht 59.5 in | Wt 150.3 lb

## 2019-09-20 DIAGNOSIS — M810 Age-related osteoporosis without current pathological fracture: Secondary | ICD-10-CM

## 2019-09-20 DIAGNOSIS — H9193 Unspecified hearing loss, bilateral: Secondary | ICD-10-CM

## 2019-09-20 DIAGNOSIS — D72829 Elevated white blood cell count, unspecified: Secondary | ICD-10-CM | POA: Insufficient documentation

## 2019-09-20 DIAGNOSIS — E038 Other specified hypothyroidism: Secondary | ICD-10-CM

## 2019-09-20 DIAGNOSIS — Q969 Turner's syndrome, unspecified: Secondary | ICD-10-CM

## 2019-09-20 DIAGNOSIS — R748 Abnormal levels of other serum enzymes: Secondary | ICD-10-CM

## 2019-09-20 DIAGNOSIS — F429 Obsessive-compulsive disorder, unspecified: Secondary | ICD-10-CM

## 2019-09-20 DIAGNOSIS — Z0000 Encounter for general adult medical examination without abnormal findings: Secondary | ICD-10-CM

## 2019-09-20 MED ORDER — FLUOXETINE HCL 40 MG PO CAPS: 40.0000 mg | ORAL_CAPSULE | Freq: Every day | ORAL | 3 refills | Status: DC

## 2019-09-20 MED ORDER — IBANDRONATE SODIUM 150 MG PO TABS: 150.0000 mg | ORAL_TABLET | ORAL | 3 refills | Status: DC

## 2019-09-20 MED ORDER — LEVOTHYROXINE SODIUM 100 MCG PO TABS: 100.0000 ug | ORAL_TABLET | Freq: Every day | ORAL | 3 refills | Status: DC

## 2019-09-20 NOTE — Assessment & Plan Note (Signed)
No s/s of illness or infection  Re check planned 6 wk

## 2019-09-20 NOTE — Assessment & Plan Note (Signed)
Lab Results  Component Value Date   TSH 4.80 (H) 07/19/2019   No clinical changes Will inc levothyroid to 100 mcg and re check tsh in 6 wk

## 2019-09-20 NOTE — Assessment & Plan Note (Signed)
Reviewed health habits including diet and exercise and skin cancer prevention Reviewed appropriate screening tests for age  Also reviewed health mt list, fam hx and immunization status , as well as social and family history   See HPI Labs reviewed  utd dexa -on boniva  Disc fall prevention and need for exercise  Urged to consider covid vaccination  Also she may consider shigrix but may not be affordable

## 2019-09-20 NOTE — Assessment & Plan Note (Signed)
dexa 8/20 Now back on boniva since then and doing well No falls or fractures since last visit  Nl vit D level

## 2019-09-20 NOTE — Patient Instructions (Addendum)
Please keep thinking about getting a covid vaccine - I think it is worth the risk of a side effects   If you are interested in the shingles vaccine series (Shingrix), call your insurance or pharmacy to check on coverage and location it must be given.  If affordable - you can schedule it here or at your pharmacy depending on coverage   Think about an exercise schedule-daily 30 minutes or more   Let's re check labs in 6 weeks for thyroid and also cbc and liver labs (hepatitis screen) Avoid tylenol and avoid alcohol  Take care of yourself

## 2019-09-20 NOTE — Progress Notes (Signed)
Subjective:    Patient ID: Debra Ford, female    DOB: [birth year], 64 y.o.   MRN: [id number]  This visit occurred during the SARS-CoV-2 public health emergency.  Safety protocols were in place, including screening questions prior to the visit, additional usage of staff PPE, and extensive cleaning of exam room while observing appropriate contact time as indicated for disinfecting solutions.    HPI Here for health maintenance exam and to review chronic medical problems    Wt Readings from Last 3 Encounters:  09/20/19 150 lb 5 oz (68.2 kg)  01/03/19 156 lb 2 oz (70.8 kg)  07/16/18 153 lb 4 oz (69.5 kg)   29.85 kg/m   covid status - is worried about that vaccine /may consider   Health care co ops help her with cost   Just had cataract surgery-it went well    Flu vaccine- she did get in sept she thinks  Tdap- 2014  Zoster status -zostavax 3/18 Interested in shingrix if affordable   Mammogram 8/20 Self breast exam - no new lumps or chagnes  Personal h/o ruptured breast implant  Pap 3/20 -neg with neg HPV   Colonoscopy 4/18 with 5y recall  Mother had colon cancer   Hypothyroidism  Pt has no clinical changes No change in energy level/ hair or skin/ edema and no tremor Lab Results  Component Value Date   TSH 4.80 (H) 07/19/2019    This is up from 3.79 Seldom to no missed doses   Osteoporosis -last dexa 8/20  Taking boniva since 8/20 -no side effects  Vit D level nl    H/o elevated liver enzymes  Lab Results  Component Value Date   ALT 66 (H) 07/19/2019   AST 38 (H) 07/19/2019   ALKPHOS 206 (H) 07/19/2019   BILITOT 0.8 07/19/2019  isoenzymes suggest liver  US of abd nl 5/20   No abd pain  No exp to hepatitis   No otc tylenol  No etoh   Diet has not changed   Mood- has been very good  H/o OCD  Lab Results  Component Value Date   WBC 10.9 (H) 07/19/2019   HGB 13.3 07/19/2019   HCT 39.8 07/19/2019   MCV 96.0 07/19/2019   PLT 252.0 07/19/2019   no recent illness  Has allergies   Lab Results  Component Value Date   CREATININE 0.72 07/19/2019   BUN 12 07/19/2019   NA 136 07/19/2019   K 4.2 07/19/2019   CL 99 07/19/2019   CO2 30 07/19/2019   Cholesterol  Lab Results  Component Value Date   CHOL 181 07/19/2019   CHOL 177 07/12/2018   CHOL 165 07/03/2017   Lab Results  Component Value Date   HDL 72.20 07/19/2019   HDL 60.90 07/12/2018   HDL 59.60 07/03/2017   Lab Results  Component Value Date   LDLCALC 85 07/19/2019   LDLCALC 88 07/12/2018   LDLCALC 84 07/03/2017   Lab Results  Component Value Date   TRIG 121.0 07/19/2019   TRIG 140.0 07/12/2018   TRIG 109.0 07/03/2017   Lab Results  Component Value Date   CHOLHDL 3 07/19/2019   CHOLHDL 3 07/12/2018   CHOLHDL 3 07/03/2017   No results found for: LDLDIRECT  Exercise -she walks a little bit   Patient Active Problem List   Diagnosis Date Noted  . Leukocytosis 09/20/2019  . Elevated liver enzymes 07/18/2018  . Screening mammogram, encounter for 07/16/2018  . Pre-operative examination 01/13/2017  .  Breast implant leak 08/06/2015  . Encounter for routine gynecological examination 07/04/2015  . Estrogen deficiency 07/04/2015  . Routine general medical examination at a health care facility 06/25/2014  . Family history of colon cancer 04/21/2011  . Osteoporosis 12/29/2006  . Hypothyroidism 11/30/2006  . Obsessive-compulsive disorder 11/20/2006  . Hearing loss 11/20/2006  . HEMORRHOIDS, INTERNAL 11/20/2006  . TURNER'S SYNDROME 11/20/2006   Past Medical History:  Diagnosis Date  . Allergy    seasonal  . Cancer ([place])    skin cancer on nose  . Cataract   . Elevated liver function tests   . Hearing loss   . Hyperthyroidism   . OCD (obsessive compulsive disorder)   . Osteopenia   . Turner's syndrome    Past Surgical History:  Procedure Laterality Date  . AUGMENTATION MAMMAPLASTY    . BREAST ENHANCEMENT SURGERY  1988   bilateral  . COLONOSCOPY      2013  . maxum  2012   hearing surgery   Social History   Tobacco Use  . Smoking status: Never Smoker  . Smokeless tobacco: Never Used  Substance Use Topics  . Alcohol use: No    Alcohol/week: 0.0 standard drinks  . Drug use: No   Family History  Problem Relation Age of Onset  . Colon cancer Mother 32  . Diabetes Father   . Colon polyps Father 50  . Heart disease Father   . Prostate cancer Father   . Diabetes Maternal Grandmother   . Diabetes Paternal Grandmother   . Diabetes Paternal Grandfather   . Stomach cancer Neg Hx   . Esophageal cancer Neg Hx   . Rectal cancer Neg Hx   . Breast cancer Neg Hx    Allergies  Allergen Reactions  . Tetanus Toxoids     Hives (?)   Current Outpatient Medications on File Prior to Visit  Medication Sig Dispense Refill  . ALPRAZolam (XANAX) 0.5 MG tablet Take 0.5-1 tablets (0.25-0.5 mg total) by mouth daily as needed for anxiety. 15 tablet 0  . CALCIUM CITRATE-VITAMIN D PO Take 2 tablets by mouth daily.      . fexofenadine (ALLEGRA) 180 MG tablet Take 180 mg by mouth daily.    . fluticasone (FLONASE) 50 MCG/ACT nasal spray Place into both nostrils daily.    . Multiple Vitamin (MULTIVITAMIN) tablet Take 1 tablet by mouth daily.      . Vitamin D, Ergocalciferol, 2000 units CAPS Take 1 capsule by mouth daily.     No current facility-administered medications on file prior to visit.    Review of Systems  Constitutional: Negative for activity change, appetite change, fatigue, fever and unexpected weight change.  HENT: Negative for congestion, ear pain, rhinorrhea, sinus pressure and sore throat.   Eyes: Negative for pain, redness and visual disturbance.  Respiratory: Negative for cough, shortness of breath and wheezing.   Cardiovascular: Negative for chest pain and palpitations.  Gastrointestinal: Negative for abdominal pain, blood in stool, constipation and diarrhea.  Endocrine: Negative for polydipsia and polyuria.  Genitourinary:  Negative for dysuria, frequency and urgency.  Musculoskeletal: Positive for arthralgias. Negative for back pain and myalgias.  Skin: Negative for pallor and rash.  Allergic/Immunologic: Negative for environmental allergies.  Neurological: Negative for dizziness, syncope, light-headedness, numbness and headaches.  Hematological: Negative for adenopathy. Does not bruise/bleed easily.  Psychiatric/Behavioral: Negative for decreased concentration and dysphoric mood. The patient is not nervous/anxious.        Objective:   Physical Exam Constitutional:  General: She is not in acute distress.    Appearance: Normal appearance. She is well-developed and normal weight. She is not ill-appearing or diaphoretic.     Comments: Overweight   HENT:     Head: Normocephalic and atraumatic.     Right Ear: Tympanic membrane, ear canal and external ear normal.     Left Ear: Tympanic membrane, ear canal and external ear normal.     Nose: Nose normal. No congestion.     Mouth/Throat:     Mouth: Mucous membranes are moist.     Pharynx: Oropharynx is clear. No posterior oropharyngeal erythema.  Eyes:     General: No scleral icterus.    Extraocular Movements: Extraocular movements intact.     Conjunctiva/sclera: Conjunctivae normal.     Pupils: Pupils are equal, round, and reactive to light.  Neck:     Thyroid: No thyromegaly.     Vascular: No carotid bruit or JVD.  Cardiovascular:     Rate and Rhythm: Normal rate and regular rhythm.     Pulses: Normal pulses.     Heart sounds: Normal heart sounds. No gallop.   Pulmonary:     Effort: Pulmonary effort is normal. No respiratory distress.     Breath sounds: Normal breath sounds. No wheezing.     Comments: Good air exch Chest:     Chest wall: No tenderness.  Abdominal:     General: Bowel sounds are normal. There is no distension or abdominal bruit.     Palpations: Abdomen is soft. There is no mass.     Tenderness: There is no abdominal tenderness.       Hernia: No hernia is present.  Genitourinary:    Comments: Breast exam: No mass, nodules, thickening, tenderness, bulging, retraction, inflamation, nipple discharge or skin changes noted.  No axillary or clavicular LA.     Breast implants noted Musculoskeletal:        General: No tenderness. Normal range of motion.     Cervical back: Normal range of motion and neck supple. No rigidity. No muscular tenderness.     Right lower leg: No edema.     Left lower leg: No edema.  Lymphadenopathy:     Cervical: No cervical adenopathy.  Skin:    General: Skin is warm and dry.     Coloration: Skin is not pale.     Findings: No erythema or rash.  Neurological:     Mental Status: She is alert. Mental status is at baseline.     Cranial Nerves: No cranial nerve deficit.     Motor: No abnormal muscle tone.     Coordination: Coordination normal.     Gait: Gait normal.     Deep Tendon Reflexes: Reflexes are normal and symmetric. Reflexes normal.  Psychiatric:        Mood and Affect: Mood and affect normal.        Cognition and Memory: Cognition and memory normal.     Comments: pleasant           Assessment & Plan:   Problem List Items Addressed This Visit      Endocrine   Hypothyroidism    Lab Results  Component Value Date   TSH 4.80 (H) 07/19/2019   No clinical changes Will inc levothyroid to 100 mcg and re check tsh in 6 wk      Relevant Medications   levothyroxine (SYNTHROID) 100 MCG tablet     Nervous and Auditory   Hearing loss  Doing well with cochlear implant         Musculoskeletal and Integument   Osteoporosis    dexa 8/20 Now back on boniva since then and doing well No falls or fractures since last visit  Nl vit D level        Relevant Medications   ibandronate (BONIVA) 150 MG tablet     Genitourinary   TURNER'S SYNDROME    No cardiac issues  Off HRT Continues levothyroxine        Other   Obsessive-compulsive disorder    Continues stable on  fluoxetine Enc good self care      Relevant Medications   FLUoxetine (PROZAC) 40 MG capsule   Routine general medical examination at a health care facility - Primary    Reviewed health habits including diet and exercise and skin cancer prevention Reviewed appropriate screening tests for age  Also reviewed health mt list, fam hx and immunization status , as well as social and family history   See HPI Labs reviewed  utd dexa -on boniva  Disc fall prevention and need for exercise  Urged to consider covid vaccination  Also she may consider shigrix but may not be affordable      Elevated liver enzymes    These went down and then back up (ast and alt and alk phos)  Nl [place] abd 5/20  Will plan more liver labs at 6 wk f/u incl hepatitis screen She avoids acetaminophen and etoh      Leukocytosis    No s/s of illness or infection  Re check planned 6 wk

## 2019-09-20 NOTE — Assessment & Plan Note (Signed)
Continues stable on fluoxetine Enc good self care

## 2019-09-20 NOTE — Assessment & Plan Note (Signed)
These went down and then back up (ast and alt and alk phos)  Nl [place] abd 5/20  Will plan more liver labs at 6 wk f/u incl hepatitis screen She avoids acetaminophen and etoh

## 2019-09-20 NOTE — Assessment & Plan Note (Signed)
Doing well with cochlear implant

## 2019-09-20 NOTE — Assessment & Plan Note (Signed)
No cardiac issues  Off HRT Continues levothyroxine

## 2019-09-22 ENCOUNTER — Other Ambulatory Visit: Payer: Self-pay | Admitting: Family Medicine

## 2019-09-22 MED ORDER — IBANDRONATE SODIUM 150 MG PO TABS: 150.0000 mg | ORAL_TABLET | ORAL | 3 refills | Status: DC

## 2019-09-22 MED ORDER — ALPRAZOLAM 0.5 MG PO TABS: 0.2500 mg | ORAL_TABLET | Freq: Every day | ORAL | 0 refills | Status: DC | PRN

## 2019-09-22 NOTE — Telephone Encounter (Signed)
See prev note  Name of Medication: xanax Name of Pharmacy: [facility] Date and Quantity: 07/16/18 #15 tabs with 0 refills Last Office Visit and Type: CPE 09/20/19 Next Office Visit and Type: CPE 09/21/20

## 2019-09-22 NOTE — Telephone Encounter (Signed)
Patient called in regards to refill for ALPRAZolam and Ibandronate     She said she normally gets a script print out at her appointment and did not get this when she was in on 5/11   Patient would like these sent to her pharmacy and asked if these could be done today    [place]

## 2019-10-31 ENCOUNTER — Telehealth: Payer: Self-pay | Admitting: Family Medicine

## 2019-10-31 DIAGNOSIS — R748 Abnormal levels of other serum enzymes: Secondary | ICD-10-CM

## 2019-10-31 DIAGNOSIS — D72829 Elevated white blood cell count, unspecified: Secondary | ICD-10-CM

## 2019-10-31 DIAGNOSIS — E038 Other specified hypothyroidism: Secondary | ICD-10-CM

## 2019-10-31 NOTE — Telephone Encounter (Signed)
-----   Message from Cloyd Stagers, RT sent at 10/19/2019  2:40 PM EDT ----- Regarding: Lab Orders for Tuesday 6.22.2021 Please place lab orders for Tuesday 6.22.2021, appt notes state "f/u labs" Thank you, Dyke Maes RT(R)

## 2019-11-01 ENCOUNTER — Other Ambulatory Visit (INDEPENDENT_AMBULATORY_CARE_PROVIDER_SITE_OTHER): Payer: Self-pay

## 2019-11-01 DIAGNOSIS — D72829 Elevated white blood cell count, unspecified: Secondary | ICD-10-CM

## 2019-11-01 DIAGNOSIS — E038 Other specified hypothyroidism: Secondary | ICD-10-CM

## 2019-11-01 DIAGNOSIS — R748 Abnormal levels of other serum enzymes: Secondary | ICD-10-CM

## 2019-11-01 LAB — CBC WITH DIFFERENTIAL/PLATELET
Basophils Absolute: 0 10*3/uL (ref 0.0–0.1)
Basophils Relative: 0.4 % (ref 0.0–3.0)
Eosinophils Absolute: 0 10*3/uL (ref 0.0–0.7)
Eosinophils Relative: 0.5 % (ref 0.0–5.0)
HCT: 39.2 % (ref 36.0–46.0)
Hemoglobin: 13.3 g/dL (ref 12.0–15.0)
Lymphocytes Relative: 21.1 % (ref 12.0–46.0)
Lymphs Abs: 1.8 10*3/uL (ref 0.7–4.0)
MCHC: 33.9 g/dL (ref 30.0–36.0)
MCV: 95.5 fl (ref 78.0–100.0)
Monocytes Absolute: 0.7 10*3/uL (ref 0.1–1.0)
Monocytes Relative: 7.6 % (ref 3.0–12.0)
Neutro Abs: 6.1 10*3/uL (ref 1.4–7.7)
Neutrophils Relative %: 70.4 % (ref 43.0–77.0)
Platelets: 231 10*3/uL (ref 150.0–400.0)
RBC: 4.1 Mil/uL (ref 3.87–5.11)
RDW: 12.4 % (ref 11.5–15.5)
WBC: 8.7 10*3/uL (ref 4.0–10.5)

## 2019-11-01 LAB — HEPATIC FUNCTION PANEL
ALT: 34 U/L (ref 0–35)
AST: 21 U/L (ref 0–37)
Albumin: 4.3 g/dL (ref 3.5–5.2)
Alkaline Phosphatase: 158 U/L — ABNORMAL HIGH (ref 39–117)
Bilirubin, Direct: 0.1 mg/dL (ref 0.0–0.3)
Total Bilirubin: 0.7 mg/dL (ref 0.2–1.2)
Total Protein: 6 g/dL (ref 6.0–8.3)

## 2019-11-01 LAB — TSH: TSH: 1.87 u[IU]/mL (ref 0.35–4.50)

## 2019-11-02 LAB — ACUTE HEP PANEL AND HEP B SURFACE AB
HEPATITIS C ANTIBODY REFILL$(REFL): NONREACTIVE
Hep A IgM: NONREACTIVE
Hep B C IgM: NONREACTIVE
Hepatitis B Surface Ag: NONREACTIVE
SIGNAL TO CUT-OFF: 0 (ref <1.00)

## 2019-11-02 LAB — REFLEX TIQ

## 2019-11-25 ENCOUNTER — Other Ambulatory Visit: Payer: Self-pay | Admitting: Family Medicine

## 2019-11-25 DIAGNOSIS — Z1231 Encounter for screening mammogram for malignant neoplasm of breast: Secondary | ICD-10-CM

## 2020-01-02 ENCOUNTER — Other Ambulatory Visit: Payer: Self-pay

## 2020-01-02 ENCOUNTER — Ambulatory Visit
Admission: RE | Admit: 2020-01-02 | Discharge: 2020-01-02 | Disposition: A | Discharge status: Home / Self Care | Payer: Self-pay | Source: Ambulatory Visit | Attending: Family Medicine | Admitting: Family Medicine

## 2020-01-02 DIAGNOSIS — Z1231 Encounter for screening mammogram for malignant neoplasm of breast: Secondary | ICD-10-CM

## 2020-03-23 ENCOUNTER — Telehealth: Payer: Self-pay | Admitting: Family Medicine

## 2020-03-23 NOTE — Telephone Encounter (Signed)
Patient came into office and stated she is needing her [facility] bill to be changed. Stated she has brought it in before and we had changed it for her. Please advise. Copy of bill and message routed to manager to inspect.

## 2020-03-28 NOTE — Telephone Encounter (Signed)
Pt called checking on bill  Best number [phone]  Can leave message

## 2020-04-02 ENCOUNTER — Telehealth: Payer: Self-pay | Admitting: Family Medicine

## 2020-04-02 NOTE — Telephone Encounter (Signed)
I returned patient's call. I apologized and explained to her that I have been out of the office. I let her know that since the bill is from [employer], she will have to call them. I advised her to ask them about financial assistance due to being self pay. She verbalized understanding and thanked me for returning her call. She states she will give them a call for assistance.

## 2020-04-02 NOTE — Telephone Encounter (Signed)
Pt came into office again to check in on bill. Stated she is very eager and wants this done soon. Gave another copy of bill. Given to office manager. Please advise.

## 2020-04-02 NOTE — Telephone Encounter (Signed)
Pt called due to she was billed for lab work and its higher than before and she is self-pay. And stated that she dropped off paperwork to get looked at for the labs week before last.

## 2020-09-10 ENCOUNTER — Other Ambulatory Visit: Payer: Self-pay | Admitting: Family Medicine

## 2020-09-13 ENCOUNTER — Telehealth: Payer: Self-pay | Admitting: Family Medicine

## 2020-09-13 DIAGNOSIS — Z0000 Encounter for general adult medical examination without abnormal findings: Secondary | ICD-10-CM

## 2020-09-13 DIAGNOSIS — E038 Other specified hypothyroidism: Secondary | ICD-10-CM

## 2020-09-13 DIAGNOSIS — M810 Age-related osteoporosis without current pathological fracture: Secondary | ICD-10-CM

## 2020-09-13 NOTE — Telephone Encounter (Signed)
-----   Message from Ellamae Sia sent at 08/28/2020  4:25 PM EDT ----- Regarding: lab orders for Friday, 5.6.22 Patient is scheduled for CPX labs, please order future labs, Thanks , Karna Christmas

## 2020-09-14 ENCOUNTER — Other Ambulatory Visit: Payer: Self-pay

## 2020-09-14 ENCOUNTER — Other Ambulatory Visit (INDEPENDENT_AMBULATORY_CARE_PROVIDER_SITE_OTHER): Payer: Self-pay

## 2020-09-14 DIAGNOSIS — M810 Age-related osteoporosis without current pathological fracture: Secondary | ICD-10-CM

## 2020-09-14 DIAGNOSIS — E038 Other specified hypothyroidism: Secondary | ICD-10-CM

## 2020-09-14 DIAGNOSIS — Z0000 Encounter for general adult medical examination without abnormal findings: Secondary | ICD-10-CM

## 2020-09-14 LAB — CBC WITH DIFFERENTIAL/PLATELET
Basophils Absolute: 0 10*3/uL (ref 0.0–0.1)
Basophils Relative: 0.3 % (ref 0.0–3.0)
Eosinophils Absolute: 0.1 10*3/uL (ref 0.0–0.7)
Eosinophils Relative: 1.1 % (ref 0.0–5.0)
HCT: 40.6 % (ref 36.0–46.0)
Hemoglobin: 13.8 g/dL (ref 12.0–15.0)
Lymphocytes Relative: 22.8 % (ref 12.0–46.0)
Lymphs Abs: 2 10*3/uL (ref 0.7–4.0)
MCHC: 34.1 g/dL (ref 30.0–36.0)
MCV: 95 fl (ref 78.0–100.0)
Monocytes Absolute: 0.8 10*3/uL (ref 0.1–1.0)
Monocytes Relative: 9.2 % (ref 3.0–12.0)
Neutro Abs: 5.9 10*3/uL (ref 1.4–7.7)
Neutrophils Relative %: 66.6 % (ref 43.0–77.0)
Platelets: 250 10*3/uL (ref 150.0–400.0)
RBC: 4.27 Mil/uL (ref 3.87–5.11)
RDW: 12.5 % (ref 11.5–15.5)
WBC: 8.8 10*3/uL (ref 4.0–10.5)

## 2020-09-14 LAB — LIPID PANEL
Cholesterol: 215 mg/dL — ABNORMAL HIGH (ref 0–200)
HDL: 73.8 mg/dL (ref >39.00)
LDL Cholesterol: 109 mg/dL — ABNORMAL HIGH (ref 0–99)
NonHDL: 141.21
Total CHOL/HDL Ratio: 3
Triglycerides: 163 mg/dL — ABNORMAL HIGH (ref 0.0–149.0)
VLDL: 32.6 mg/dL (ref 0.0–40.0)

## 2020-09-14 LAB — COMPREHENSIVE METABOLIC PANEL
ALT: 45 U/L — ABNORMAL HIGH (ref 0–35)
AST: 23 U/L (ref 0–37)
Albumin: 4.4 g/dL (ref 3.5–5.2)
Alkaline Phosphatase: 197 U/L — ABNORMAL HIGH (ref 39–117)
BUN: 13 mg/dL (ref 6–23)
CO2: 31 mEq/L (ref 19–32)
Calcium: 9.5 mg/dL (ref 8.4–10.5)
Chloride: 100 mEq/L (ref 96–112)
Creatinine, Ser: 0.69 mg/dL (ref 0.40–1.20)
GFR: 92.79 mL/min (ref >60.00)
Glucose, Bld: 97 mg/dL (ref 70–99)
Potassium: 4.3 mEq/L (ref 3.5–5.1)
Sodium: 138 mEq/L (ref 135–145)
Total Bilirubin: 1 mg/dL (ref 0.2–1.2)
Total Protein: 6.5 g/dL (ref 6.0–8.3)

## 2020-09-14 LAB — TSH: TSH: 2.33 u[IU]/mL (ref 0.35–4.50)

## 2020-09-14 LAB — VITAMIN D 25 HYDROXY (VIT D DEFICIENCY, FRACTURES): VITD: 50.33 ng/mL (ref 30.00–100.00)

## 2020-09-21 ENCOUNTER — Encounter: Payer: Self-pay | Admitting: Family Medicine

## 2020-09-21 ENCOUNTER — Other Ambulatory Visit: Payer: Self-pay

## 2020-09-21 ENCOUNTER — Ambulatory Visit (INDEPENDENT_AMBULATORY_CARE_PROVIDER_SITE_OTHER): Payer: Self-pay | Admitting: Family Medicine

## 2020-09-21 VITALS — BP 128/72 | HR 87 | Temp 97.0°F | Ht 59.25 in | Wt 145.2 lb

## 2020-09-21 DIAGNOSIS — R748 Abnormal levels of other serum enzymes: Secondary | ICD-10-CM

## 2020-09-21 DIAGNOSIS — Z0000 Encounter for general adult medical examination without abnormal findings: Secondary | ICD-10-CM

## 2020-09-21 DIAGNOSIS — E038 Other specified hypothyroidism: Secondary | ICD-10-CM

## 2020-09-21 DIAGNOSIS — Z800 Family history of malignant neoplasm of digestive organs: Secondary | ICD-10-CM

## 2020-09-21 DIAGNOSIS — H9193 Unspecified hearing loss, bilateral: Secondary | ICD-10-CM

## 2020-09-21 DIAGNOSIS — M810 Age-related osteoporosis without current pathological fracture: Secondary | ICD-10-CM

## 2020-09-21 MED ORDER — LEVOTHYROXINE SODIUM 100 MCG PO TABS: 100.0000 ug | ORAL_TABLET | Freq: Every day | ORAL | 3 refills | Status: DC

## 2020-09-21 MED ORDER — FLUOXETINE HCL 40 MG PO CAPS: 40.0000 mg | ORAL_CAPSULE | Freq: Every day | ORAL | 3 refills | Status: DC

## 2020-09-21 MED ORDER — IBANDRONATE SODIUM 150 MG PO TABS: 150.0000 mg | ORAL_TABLET | ORAL | 3 refills | Status: DC

## 2020-09-21 MED ORDER — ALPRAZOLAM 0.5 MG PO TABS: 0.2500 mg | ORAL_TABLET | Freq: Every day | ORAL | 0 refills | Status: DC | PRN

## 2020-09-21 NOTE — Patient Instructions (Addendum)
You are due for a bone density after sept of 2022   Cholesterol is up a bit  The candy bars may be why Avoid red meat/ fried foods/ egg yolks/ fatty breakfast meats/ butter, cheese and high fat dairy/ and shellfish

## 2020-09-21 NOTE — Progress Notes (Signed)
Subjective:    Patient ID: Debra Ford, female    DOB: 24-Sep-1955, 65 y.o.   MRN: [id number]  This visit occurred during the SARS-CoV-2 public health emergency.  Safety protocols were in place, including screening questions prior to the visit, additional usage of staff PPE, and extensive cleaning of exam room while observing appropriate contact time as indicated for disinfecting solutions.    HPI  Here for health maintenance exam and to review chronic medical problems    Wt Readings from Last 3 Encounters:  09/21/20 145 lb 3 oz (65.9 kg)  09/20/19 150 lb 5 oz (68.2 kg)  01/03/19 156 lb 2 oz (70.8 kg)   29.08 kg/m Feeling ok   covid status -not vaccinated/ not going to  Husband got covid and she had a little headache- could have had it    Flu shot - did not get this season /decided to stop them  Tdap 2/14 zostavax 3/18  Colonoscopy 4/18 with 5 y recall  Her mother had colon cancer at age 90 Father had colon polyps at age 26   Mammogram 8/21 Self breast exam -no lumps   Pap 3/20-neg with neg hpv screen   dexa 8/20-OP Taking boniva since 12/2018, no side effects  Falls -none new Fractures -none  Supplements- ca and D  Exercise - started walking more regularly , getting started on a routine   BP Readings from Last 3 Encounters:  09/21/20 128/72  09/20/19 128/78  01/03/19 124/68   Pulse Readings from Last 3 Encounters:  09/21/20 87  09/20/19 87  01/03/19 83   Hypothyroidism  Pt has no clinical changes No change in energy level/ hair or skin/ edema and no tremor Lab Results  Component Value Date   TSH 2.33 09/14/2020     Takes euthroxy 100 mcg daily  H/o elevated liver enzymes  Lab Results  Component Value Date   ALT 45 (H) 09/14/2020   AST 23 09/14/2020   ALKPHOS 197 (H) 09/14/2020   BILITOT 1.0 09/14/2020   Neg hepatitis tests Avoids etoh and acetaminophen Nl [place] abd 5/20  Cholesterol  Lab Results  Component Value Date   CHOL 215 (H)  09/14/2020   CHOL 181 07/19/2019   CHOL 177 07/12/2018   Lab Results  Component Value Date   HDL 73.80 09/14/2020   HDL 72.20 07/19/2019   HDL 60.90 07/12/2018   Lab Results  Component Value Date   LDLCALC 109 (H) 09/14/2020   LDLCALC 85 07/19/2019   LDLCALC 88 07/12/2018   Lab Results  Component Value Date   TRIG 163.0 (H) 09/14/2020   TRIG 121.0 07/19/2019   TRIG 140.0 07/12/2018   Lab Results  Component Value Date   CHOLHDL 3 09/14/2020   CHOLHDL 3 07/19/2019   CHOLHDL 3 07/12/2018   No results found for: LDLDIRECT  LDL is up  She has been eating chocolate bars more often    Mood Takes prozac 40 mg daily  Mood is good    Has a patch of skin ?psoriasis on leg cetaphil helped  Lab Results  Component Value Date   WBC 8.8 09/14/2020   HGB 13.8 09/14/2020   HCT 40.6 09/14/2020   MCV 95.0 09/14/2020   PLT 250.0 09/14/2020   Lab Results  Component Value Date   CREATININE 0.69 09/14/2020   BUN 13 09/14/2020   NA 138 09/14/2020   K 4.3 09/14/2020   CL 100 09/14/2020   CO2 31 09/14/2020   Vit  D is 50.3   Patient Active Problem List   Diagnosis Date Noted  . Elevated liver enzymes 07/18/2018  . Pre-operative examination 01/13/2017  . Breast implant leak 08/06/2015  . Encounter for routine gynecological examination 07/04/2015  . Estrogen deficiency 07/04/2015  . Routine general medical examination at a health care facility 06/25/2014  . Family history of colon cancer 04/21/2011  . Osteoporosis 12/29/2006  . Hypothyroidism 11/30/2006  . Obsessive-compulsive disorder 11/20/2006  . Hearing loss 11/20/2006  . HEMORRHOIDS, INTERNAL 11/20/2006  . TURNER'S SYNDROME 11/20/2006   Past Medical History:  Diagnosis Date  . Allergy    seasonal  . Cancer ([place])    skin cancer on nose  . Cataract   . Elevated liver function tests   . Hearing loss   . Hyperthyroidism   . OCD (obsessive compulsive disorder)   . Osteopenia   . Turner's syndrome    Past  Surgical History:  Procedure Laterality Date  . AUGMENTATION MAMMAPLASTY    . BREAST ENHANCEMENT SURGERY  1988   bilateral  . COLONOSCOPY     2013  . maxum  2012   hearing surgery   Social History   Tobacco Use  . Smoking status: Never Smoker  . Smokeless tobacco: Never Used  Substance Use Topics  . Alcohol use: No    Alcohol/week: 0.0 standard drinks  . Drug use: No   Family History  Problem Relation Age of Onset  . Colon cancer Mother 46  . Diabetes Father   . Colon polyps Father 106  . Heart disease Father   . Prostate cancer Father   . Diabetes Maternal Grandmother   . Diabetes Paternal Grandmother   . Diabetes Paternal Grandfather   . Stomach cancer Neg Hx   . Esophageal cancer Neg Hx   . Rectal cancer Neg Hx   . Breast cancer Neg Hx    Allergies  Allergen Reactions  . Tetanus Toxoids     Hives (?)   Current Outpatient Medications on File Prior to Visit  Medication Sig Dispense Refill  . CALCIUM CITRATE-VITAMIN D PO Take 2 tablets by mouth daily.    . fexofenadine (ALLEGRA) 180 MG tablet Take 180 mg by mouth daily.    . fluticasone (FLONASE) 50 MCG/ACT nasal spray Place into both nostrils daily.    . Multiple Vitamin (MULTIVITAMIN) tablet Take 1 tablet by mouth daily.    . Vitamin D, Ergocalciferol, 2000 units CAPS Take 1 capsule by mouth daily.     No current facility-administered medications on file prior to visit.    Review of Systems  Constitutional: Negative for activity change, appetite change, fatigue, fever and unexpected weight change.  HENT: Positive for hearing loss. Negative for congestion, ear pain, rhinorrhea, sinus pressure and sore throat.   Eyes: Negative for pain, redness and visual disturbance.  Respiratory: Negative for cough, shortness of breath and wheezing.   Cardiovascular: Negative for chest pain and palpitations.  Gastrointestinal: Negative for abdominal pain, blood in stool, constipation and diarrhea.  Endocrine: Negative for  polydipsia and polyuria.  Genitourinary: Negative for dysuria, frequency and urgency.  Musculoskeletal: Negative for arthralgias, back pain and myalgias.  Skin: Negative for pallor and rash.  Allergic/Immunologic: Negative for environmental allergies.  Neurological: Negative for dizziness, syncope and headaches.  Hematological: Negative for adenopathy. Does not bruise/bleed easily.  Psychiatric/Behavioral: Negative for decreased concentration and dysphoric mood. The patient is not nervous/anxious.        Objective:   Physical Exam Constitutional:  General: She is not in acute distress.    Appearance: Normal appearance. She is well-developed. She is not ill-appearing or diaphoretic.     Comments: Overweight   HENT:     Head: Normocephalic and atraumatic.     Right Ear: Tympanic membrane, ear canal and external ear normal.     Left Ear: Tympanic membrane, ear canal and external ear normal.     Nose: Nose normal. No congestion.     Mouth/Throat:     Mouth: Mucous membranes are moist.     Pharynx: Oropharynx is clear. No posterior oropharyngeal erythema.  Eyes:     General: No scleral icterus.    Extraocular Movements: Extraocular movements intact.     Conjunctiva/sclera: Conjunctivae normal.     Pupils: Pupils are equal, round, and reactive to light.  Neck:     Thyroid: No thyromegaly.     Vascular: No carotid bruit or JVD.  Cardiovascular:     Rate and Rhythm: Normal rate and regular rhythm.     Pulses: Normal pulses.     Heart sounds: Normal heart sounds. No gallop.   Pulmonary:     Effort: Pulmonary effort is normal. No respiratory distress.     Breath sounds: Normal breath sounds. No wheezing.     Comments: Good air exch Chest:     Chest wall: No tenderness.  Abdominal:     General: Bowel sounds are normal. There is no distension or abdominal bruit.     Palpations: Abdomen is soft. There is no mass.     Tenderness: There is no abdominal tenderness.     Hernia: No  hernia is present.  Genitourinary:    Comments: Breast exam: No mass, nodules, thickening, tenderness, bulging, retraction, inflamation, nipple discharge or skin changes noted.  No axillary or clavicular LA.     Breast implants noted Musculoskeletal:        General: No tenderness. Normal range of motion.     Cervical back: Normal range of motion and neck supple. No rigidity. No muscular tenderness.     Right lower leg: No edema.     Left lower leg: No edema.  Lymphadenopathy:     Cervical: No cervical adenopathy.  Skin:    General: Skin is warm and dry.     Coloration: Skin is not pale.     Findings: No erythema or rash.     Comments: Solar lentigines diffusely   Neurological:     Mental Status: She is alert. Mental status is at baseline.     Cranial Nerves: No cranial nerve deficit.     Motor: No abnormal muscle tone.     Coordination: Coordination normal.     Gait: Gait normal.     Deep Tendon Reflexes: Reflexes are normal and symmetric. Reflexes normal.  Psychiatric:        Mood and Affect: Mood normal.        Cognition and Memory: Cognition and memory normal.           Assessment & Plan:   Problem List Items Addressed This Visit      Endocrine   Hypothyroidism    Hypothyroidism  Pt has no clinical changes No change in energy level/ hair or skin/ edema and no tremor Lab Results  Component Value Date   TSH 2.33 09/14/2020     Plan to continue euthyroxy 100 mcg daily      Relevant Medications   levothyroxine (EUTHYROX) 100 MCG tablet  Nervous and Auditory   Hearing loss    Doing well with cochlear implant        Musculoskeletal and Integument   Osteoporosis    dexa 8/20 Taking boniva since then and tolerating well  No falls or fractures  Taking ca and D more exercise encouraged       Relevant Medications   ibandronate (BONIVA) 150 MG tablet     Other   Family history of colon cancer    Colonoscopy due 08/2021  Mother had colon cancer at  54 Father had polyps at 32      Routine general medical examination at a health care facility - Primary    Reviewed health habits including diet and exercise and skin cancer prevention Reviewed appropriate screening tests for age  Also reviewed health mt list, fam hx and immunization status , as well as social and family history   See HPI Labs reviewed  Declines flu shots and covid imms Mammogram 8/21  Colonoscopy 4/18 - due at 7 y for family hx  Pap neg 3/20 with neg HPV dexa due in august-reviewed , no falls or fx      Elevated liver enzymes    Alk phos and ALT-up and down  Neg heptatis tests Avoiding etoh and acetaminophen  Nl [place] of abd in 2020

## 2020-09-22 NOTE — Assessment & Plan Note (Signed)
Alk phos and ALT-up and down  Neg heptatis tests Avoiding etoh and acetaminophen  Nl [place] of abd in 2020

## 2020-09-22 NOTE — Assessment & Plan Note (Signed)
Doing well with cochlear implant  

## 2020-09-22 NOTE — Assessment & Plan Note (Signed)
Reviewed health habits including diet and exercise and skin cancer prevention Reviewed appropriate screening tests for age  Also reviewed health mt list, fam hx and immunization status , as well as social and family history   See HPI Labs reviewed  Declines flu shots and covid imms Mammogram 8/21  Colonoscopy 4/18 - due at 14 y for family hx  Pap neg 3/20 with neg HPV dexa due in august-reviewed , no falls or fx

## 2020-09-22 NOTE — Assessment & Plan Note (Signed)
Colonoscopy due 08/2021  Mother had colon cancer at 97 Father had polyps at 38

## 2020-09-22 NOTE — Assessment & Plan Note (Signed)
dexa 8/20 Taking boniva since then and tolerating well  No falls or fractures  Taking ca and D more exercise encouraged

## 2020-09-22 NOTE — Assessment & Plan Note (Signed)
Hypothyroidism  Pt has no clinical changes No change in energy level/ hair or skin/ edema and no tremor Lab Results  Component Value Date   TSH 2.33 09/14/2020     Plan to continue euthyroxy 100 mcg daily

## 2020-09-27 ENCOUNTER — Other Ambulatory Visit: Payer: Self-pay | Admitting: Family Medicine

## 2020-09-27 DIAGNOSIS — Z1231 Encounter for screening mammogram for malignant neoplasm of breast: Secondary | ICD-10-CM

## 2020-11-02 ENCOUNTER — Other Ambulatory Visit: Payer: Self-pay | Admitting: Family Medicine

## 2020-12-13 DIAGNOSIS — Z1231 Encounter for screening mammogram for malignant neoplasm of breast: Secondary | ICD-10-CM

## 2021-01-10 ENCOUNTER — Ambulatory Visit
Admission: RE | Admit: 2021-01-10 | Discharge: 2021-01-10 | Disposition: A | Discharge status: Home / Self Care | Payer: No Typology Code available for payment source | Source: Ambulatory Visit

## 2021-01-10 ENCOUNTER — Other Ambulatory Visit: Payer: Self-pay

## 2021-01-10 DIAGNOSIS — Z1231 Encounter for screening mammogram for malignant neoplasm of breast: Secondary | ICD-10-CM

## 2021-07-09 ENCOUNTER — Telehealth: Payer: Self-pay | Admitting: *Deleted

## 2021-07-09 NOTE — Telephone Encounter (Signed)
Left VM with dentist letting them know Dr. Marliss Coots comments and to send form to ear/hearing specialist

## 2021-07-09 NOTE — Telephone Encounter (Signed)
Medical clearance for dental treatment form received. It states pt needs xrays but due to her hearing aids they need medical clearance from PCP.   Form placed in PCP's inbox for review

## 2021-07-09 NOTE — Telephone Encounter (Signed)
Unfortunately I don't know anything about her cochlear implants with respect to radiation They will need to reach out to her ear specialist  I put the form back in IN box

## 2021-07-22 ENCOUNTER — Other Ambulatory Visit: Payer: Self-pay | Admitting: Family Medicine

## 2021-07-25 ENCOUNTER — Other Ambulatory Visit: Payer: Self-pay | Admitting: Family Medicine

## 2021-09-15 ENCOUNTER — Telehealth: Payer: Self-pay | Admitting: Family Medicine

## 2021-09-15 DIAGNOSIS — Z0000 Encounter for general adult medical examination without abnormal findings: Secondary | ICD-10-CM

## 2021-09-15 DIAGNOSIS — M810 Age-related osteoporosis without current pathological fracture: Secondary | ICD-10-CM

## 2021-09-15 DIAGNOSIS — R748 Abnormal levels of other serum enzymes: Secondary | ICD-10-CM

## 2021-09-15 DIAGNOSIS — E038 Other specified hypothyroidism: Secondary | ICD-10-CM

## 2021-09-15 NOTE — Telephone Encounter (Signed)
-----   Message from Velna Hatchet, RT sent at 09/02/2021  8:59 AM EDT ----- ?Regarding: Lab Mon 09/16/21 ?Patient is scheduled for cpx, please order future labs.  Thanks, Anda Kraft  ? ?

## 2021-09-16 ENCOUNTER — Other Ambulatory Visit (INDEPENDENT_AMBULATORY_CARE_PROVIDER_SITE_OTHER): Payer: Self-pay

## 2021-09-16 DIAGNOSIS — M810 Age-related osteoporosis without current pathological fracture: Secondary | ICD-10-CM

## 2021-09-16 DIAGNOSIS — E038 Other specified hypothyroidism: Secondary | ICD-10-CM

## 2021-09-16 DIAGNOSIS — Z0000 Encounter for general adult medical examination without abnormal findings: Secondary | ICD-10-CM

## 2021-09-16 LAB — LIPID PANEL
Cholesterol: 198 mg/dL (ref 0–200)
HDL: 71 mg/dL (ref >39.00)
LDL Cholesterol: 99 mg/dL (ref 0–99)
NonHDL: 126.53
Total CHOL/HDL Ratio: 3
Triglycerides: 137 mg/dL (ref 0.0–149.0)
VLDL: 27.4 mg/dL (ref 0.0–40.0)

## 2021-09-16 LAB — CBC WITH DIFFERENTIAL/PLATELET
Basophils Absolute: 0.1 10*3/uL (ref 0.0–0.1)
Basophils Relative: 0.7 % (ref 0.0–3.0)
Eosinophils Absolute: 0.1 10*3/uL (ref 0.0–0.7)
Eosinophils Relative: 1.3 % (ref 0.0–5.0)
HCT: 40 % (ref 36.0–46.0)
Hemoglobin: 13.2 g/dL (ref 12.0–15.0)
Lymphocytes Relative: 26.5 % (ref 12.0–46.0)
Lymphs Abs: 2 10*3/uL (ref 0.7–4.0)
MCHC: 33.1 g/dL (ref 30.0–36.0)
MCV: 95.6 fl (ref 78.0–100.0)
Monocytes Absolute: 0.7 10*3/uL (ref 0.1–1.0)
Monocytes Relative: 9.7 % (ref 3.0–12.0)
Neutro Abs: 4.7 10*3/uL (ref 1.4–7.7)
Neutrophils Relative %: 61.8 % (ref 43.0–77.0)
Platelets: 215 10*3/uL (ref 150.0–400.0)
RBC: 4.18 Mil/uL (ref 3.87–5.11)
RDW: 12.4 % (ref 11.5–15.5)
WBC: 7.7 10*3/uL (ref 4.0–10.5)

## 2021-09-16 LAB — COMPREHENSIVE METABOLIC PANEL
ALT: 38 U/L — ABNORMAL HIGH (ref 0–35)
AST: 21 U/L (ref 0–37)
Albumin: 4.3 g/dL (ref 3.5–5.2)
Alkaline Phosphatase: 151 U/L — ABNORMAL HIGH (ref 39–117)
BUN: 13 mg/dL (ref 6–23)
CO2: 31 mEq/L (ref 19–32)
Calcium: 9.1 mg/dL (ref 8.4–10.5)
Chloride: 100 mEq/L (ref 96–112)
Creatinine, Ser: 0.7 mg/dL (ref 0.40–1.20)
GFR: 91.81 mL/min (ref >60.00)
Glucose, Bld: 94 mg/dL (ref 70–99)
Potassium: 4.2 mEq/L (ref 3.5–5.1)
Sodium: 138 mEq/L (ref 135–145)
Total Bilirubin: 0.9 mg/dL (ref 0.2–1.2)
Total Protein: 6.2 g/dL (ref 6.0–8.3)

## 2021-09-17 LAB — TSH: TSH: 2.49 u[IU]/mL (ref 0.35–5.50)

## 2021-09-17 LAB — VITAMIN D 25 HYDROXY (VIT D DEFICIENCY, FRACTURES): VITD: 42.54 ng/mL (ref 30.00–100.00)

## 2021-09-23 ENCOUNTER — Other Ambulatory Visit: Payer: Self-pay | Admitting: Family Medicine

## 2021-09-23 ENCOUNTER — Encounter: Payer: Self-pay | Admitting: Family Medicine

## 2021-09-23 ENCOUNTER — Encounter: Payer: Self-pay | Admitting: Gastroenterology

## 2021-09-23 ENCOUNTER — Ambulatory Visit (INDEPENDENT_AMBULATORY_CARE_PROVIDER_SITE_OTHER): Payer: Self-pay | Admitting: Family Medicine

## 2021-09-23 VITALS — BP 118/72 | HR 76 | Ht 59.0 in | Wt 148.0 lb

## 2021-09-23 DIAGNOSIS — E2839 Other primary ovarian failure: Secondary | ICD-10-CM

## 2021-09-23 DIAGNOSIS — H9193 Unspecified hearing loss, bilateral: Secondary | ICD-10-CM

## 2021-09-23 DIAGNOSIS — Z0000 Encounter for general adult medical examination without abnormal findings: Secondary | ICD-10-CM

## 2021-09-23 DIAGNOSIS — Z1231 Encounter for screening mammogram for malignant neoplasm of breast: Secondary | ICD-10-CM

## 2021-09-23 DIAGNOSIS — M810 Age-related osteoporosis without current pathological fracture: Secondary | ICD-10-CM

## 2021-09-23 DIAGNOSIS — F429 Obsessive-compulsive disorder, unspecified: Secondary | ICD-10-CM

## 2021-09-23 DIAGNOSIS — L304 Erythema intertrigo: Secondary | ICD-10-CM | POA: Insufficient documentation

## 2021-09-23 DIAGNOSIS — E038 Other specified hypothyroidism: Secondary | ICD-10-CM

## 2021-09-23 DIAGNOSIS — Z800 Family history of malignant neoplasm of digestive organs: Secondary | ICD-10-CM

## 2021-09-23 DIAGNOSIS — R748 Abnormal levels of other serum enzymes: Secondary | ICD-10-CM

## 2021-09-23 MED ORDER — KETOCONAZOLE 2 % EX CREA: 1.0000 "application " | TOPICAL_CREAM | Freq: Every day | CUTANEOUS | 0 refills | Status: DC

## 2021-09-23 MED ORDER — FLUOXETINE HCL 40 MG PO CAPS: 40.0000 mg | ORAL_CAPSULE | Freq: Every day | ORAL | 3 refills | Status: DC

## 2021-09-23 MED ORDER — ALPRAZOLAM 0.5 MG PO TABS: 0.2500 mg | ORAL_TABLET | Freq: Every day | ORAL | 0 refills | Status: DC | PRN

## 2021-09-23 MED ORDER — LEVOTHYROXINE SODIUM 100 MCG PO TABS: 100.0000 ug | ORAL_TABLET | Freq: Every day | ORAL | 3 refills | Status: DC

## 2021-09-23 NOTE — Assessment & Plan Note (Signed)
Stable  ?Nl [place] abd in 2020  ?No symptoms  ?Will continue to follow  ?

## 2021-09-23 NOTE — Assessment & Plan Note (Signed)
Due for colonoscopy  ?Referral done to GI ? ?

## 2021-09-23 NOTE — Patient Instructions (Addendum)
Get your mammogram and dexa in September  ?You can call and schedule it at the breast center  ? ? ?Call and schedule your colonoscopy at [facility] :  ?[facility]  [phone] ? ?Take care of yourself  ?Try and get some more exercise when you can  ? ?Continue your vitamin D ? ? ? ? ? ?

## 2021-09-23 NOTE — Assessment & Plan Note (Signed)
Stable as long as she takes fluoxetine 40 mg daily  ?Reviewed stressors/ coping techniques/symptoms/ support sources/ tx options and side effects in detail today ? ?

## 2021-09-23 NOTE — Progress Notes (Signed)
? ?Subjective:  ? ? Patient ID: Debra Ford, female    DOB: 29-Apr-1956, 66 y.o.   MRN: [id number] ? ?HPI ?Here for health maintenance exam and to review chronic medical problems   ? ?Wt Readings from Last 3 Encounters:  ?09/23/21 148 lb (67.1 kg)  ?09/21/20 145 lb 3 oz (65.9 kg)  ?09/20/19 150 lb 5 oz (68.2 kg)  ? ?29.89 kg/m? ? ?Staying busy  ?Day to day  ?Feeling good  ? ? ?Immunization History  ?Administered Date(s) Administered  ? Influenza Split 04/21/2011, 02/18/2013, 01/11/2018  ? Influenza Whole 02/23/2002, 02/13/2009, 02/27/2010, 02/20/2012  ? Influenza,inj,Quad PF,6+ Mos 01/13/2017, 01/12/2018  ? Influenza-Unspecified 03/17/2014, 03/01/2015, 01/25/2016  ? Pneumococcal Polysaccharide-23 01/13/2017  ? Td 07/12/2002  ? Tdap 06/30/2012  ? Zoster, Live 07/15/2016  ? ?Shingrix : declines  ? ?Pap 07/2018  nl with neg hpv ?No gyn problems  ? ?Had skin problem in crease of her leg  ?Itches  ?Intertrigo? ?Otc antifungal did not help  ? ?Colonoscopy 08/2016 with 5 y recall ?Mother had colon cancer at age 16  ?Wants to schedule that  ? ?Mammogram 01/2021 ?Self breast exam: no changes  ?Has had issues with implants in the past  ? ?Bone health ?Dexa  2020 - OP  ?Boniva since 2020  ?Falls : none recent  ?Fx :none  ?Supplements  vit D 2000 iu daily  ?Exercise : walking at times    (when she has time)  ? ?BP Readings from Last 3 Encounters:  ?09/23/21 118/72  ?09/21/20 128/72  ?09/20/19 128/78  ? ?Pulse Readings from Last 3 Encounters:  ?09/23/21 76  ?09/21/20 87  ?09/20/19 87  ? ? ?Hypothyroidism  ?Pt has no clinical changes ?No change in energy level/ hair or skin/ edema and no tremor ?Lab Results  ?Component Value Date  ? TSH 2.49 09/16/2021  ?   ?levothy 100 mcg daily ? ?H/o chronic inc LFTs ?Lab Results  ?Component Value Date  ? ALT 38 (H) 09/16/2021  ? AST 21 09/16/2021  ? ALKPHOS 151 (H) 09/16/2021  ? BILITOT 0.9 09/16/2021  ? ?Nl [place] abd 2020  ? ?Lab Results  ?Component Value Date  ? WBC 7.7 09/16/2021  ? HGB 13.2  09/16/2021  ? HCT 40.0 09/16/2021  ? MCV 95.6 09/16/2021  ? PLT 215.0 09/16/2021  ? ?Lab Results  ?Component Value Date  ? CREATININE 0.70 09/16/2021  ? BUN 13 09/16/2021  ? NA 138 09/16/2021  ? K 4.2 09/16/2021  ? CL 100 09/16/2021  ? CO2 31 09/16/2021  ? ?Patient Active Problem List  ? Diagnosis Date Noted  ? Intertrigo 09/23/2021  ? Elevated liver enzymes 07/18/2018  ? Pre-operative examination 01/13/2017  ? Breast implant leak 08/06/2015  ? Encounter for routine gynecological examination 07/04/2015  ? Estrogen deficiency 07/04/2015  ? Routine general medical examination at a health care facility 06/25/2014  ? Family history of colon cancer 04/21/2011  ? Osteoporosis 12/29/2006  ? Hypothyroidism 11/30/2006  ? Obsessive-compulsive disorder 11/20/2006  ? Hearing loss 11/20/2006  ? HEMORRHOIDS, INTERNAL 11/20/2006  ? TURNER'S SYNDROME 11/20/2006  ? ?Past Medical History:  ?Diagnosis Date  ? Allergy   ? seasonal  ? Cancer [facility])   ? skin cancer on nose  ? Cataract   ? Elevated liver function tests   ? Hearing loss   ? Hyperthyroidism   ? OCD (obsessive compulsive disorder)   ? Osteopenia   ? Turner's syndrome   ? ?Past Surgical History:  ?Procedure Laterality  Date  ? AUGMENTATION MAMMAPLASTY    ? BREAST ENHANCEMENT SURGERY  1988  ? bilateral  ? COLONOSCOPY    ? 2013  ? maxum  2012  ? hearing surgery  ? ?Social History  ? ?Tobacco Use  ? Smoking status: Never  ? Smokeless tobacco: Never  ?Substance Use Topics  ? Alcohol use: No  ?  Alcohol/week: 0.0 standard drinks  ? Drug use: No  ? ?Family History  ?Problem Relation Age of Onset  ? Colon cancer Mother 54  ? Diabetes Father   ? Colon polyps Father 60  ? Heart disease Father   ? Prostate cancer Father   ? Diabetes Maternal Grandmother   ? Diabetes Paternal Grandmother   ? Diabetes Paternal Grandfather   ? Stomach cancer Neg Hx   ? Esophageal cancer Neg Hx   ? Rectal cancer Neg Hx   ? Breast cancer Neg Hx   ? ?Allergies  ?Allergen Reactions  ? Tetanus Toxoids   ?  Hives  (?)  ? ?Current Outpatient Medications on File Prior to Visit  ?Medication Sig Dispense Refill  ? CALCIUM CITRATE-VITAMIN D PO Take 2 tablets by mouth daily.    ? fexofenadine (ALLEGRA) 180 MG tablet Take 180 mg by mouth daily.    ? fluticasone (FLONASE) 50 MCG/ACT nasal spray Place into both nostrils daily.    ? ibandronate (BONIVA) 150 MG tablet Take 1 tablet (150 mg total) by mouth every 30 (thirty) days. Take in the morning with a full glass of water, on an empty stomach, and do not take anything else by mouth or lie down for the next 30 min. 3 tablet 3  ? Multiple Vitamin (MULTIVITAMIN) tablet Take 1 tablet by mouth daily.    ? Vitamin D, Ergocalciferol, 2000 units CAPS Take 1 capsule by mouth daily.    ? ?No current facility-administered medications on file prior to visit.  ?  ? ? ?Review of Systems  ?Constitutional:  Negative for activity change, appetite change, fatigue, fever and unexpected weight change.  ?HENT:  Negative for congestion, ear pain, rhinorrhea, sinus pressure and sore throat.   ?Eyes:  Negative for pain, redness and visual disturbance.  ?Respiratory:  Negative for cough, shortness of breath and wheezing.   ?Cardiovascular:  Negative for chest pain and palpitations.  ?Gastrointestinal:  Negative for abdominal pain, blood in stool, constipation and diarrhea.  ?Endocrine: Negative for polydipsia and polyuria.  ?Genitourinary:  Negative for dysuria, frequency and urgency.  ?Musculoskeletal:  Negative for arthralgias, back pain and myalgias.  ?Skin:  Negative for pallor and rash.  ?Allergic/Immunologic: Negative for environmental allergies.  ?Neurological:  Negative for dizziness, syncope and headaches.  ?Hematological:  Negative for adenopathy. Does not bruise/bleed easily.  ?Psychiatric/Behavioral:  Negative for decreased concentration and dysphoric mood. The patient is not nervous/anxious.   ? ?   ?Objective:  ? Physical Exam ?Constitutional:   ?   General: She is not in acute distress. ?    Appearance: Normal appearance. She is well-developed. She is obese. She is not ill-appearing or diaphoretic.  ?HENT:  ?   Head: Normocephalic and atraumatic.  ?   Right Ear: Tympanic membrane, ear canal and external ear normal.  ?   Left Ear: Tympanic membrane, ear canal and external ear normal.  ?   Nose: Nose normal. No congestion.  ?   Mouth/Throat:  ?   Mouth: Mucous membranes are moist.  ?   Pharynx: Oropharynx is clear. No posterior oropharyngeal erythema.  ?  Eyes:  ?   General: No scleral icterus. ?   Extraocular Movements: Extraocular movements intact.  ?   Conjunctiva/sclera: Conjunctivae normal.  ?   Pupils: Pupils are equal, round, and reactive to light.  ?Neck:  ?   Thyroid: No thyromegaly.  ?   Vascular: No carotid bruit or JVD.  ?Cardiovascular:  ?   Rate and Rhythm: Normal rate and regular rhythm.  ?   Pulses: Normal pulses.  ?   Heart sounds: Normal heart sounds.  ?  No gallop.  ?Pulmonary:  ?   Effort: Pulmonary effort is normal. No respiratory distress.  ?   Breath sounds: Normal breath sounds. No wheezing or rales.  ?   Comments: Good air exch ?Chest:  ?   Chest wall: No tenderness.  ?Abdominal:  ?   General: Bowel sounds are normal. There is no distension or abdominal bruit.  ?   Palpations: Abdomen is soft. There is no mass.  ?   Tenderness: There is no abdominal tenderness.  ?   Hernia: No hernia is present.  ?Genitourinary: ?   Comments: Breast exam: No mass, nodules, thickening, tenderness, bulging, retraction, inflamation, nipple discharge or skin changes noted.  No axillary or clavicular LA.     ?Breast implants noted  ?Musculoskeletal:     ?   General: No tenderness. Normal range of motion.  ?   Cervical back: Normal range of motion and neck supple. No rigidity. No muscular tenderness.  ?   Right lower leg: No edema.  ?   Left lower leg: No edema.  ?   Comments: No kyphosis   ?Lymphadenopathy:  ?   Cervical: No cervical adenopathy.  ?Skin: ?   General: Skin is warm and dry.  ?   Coloration:  Skin is not pale.  ?   Findings: No erythema or rash.  ?   Comments: Some lentigines  ? ?R groin, erythema with a few satellite lesions  ?  ?Neurological:  ?   Mental Status: She is alert. Mental statu

## 2021-09-23 NOTE — Assessment & Plan Note (Signed)
In R groin ?Px ketoconazole to use daily  ?Enc to keep clean and dry  ?Update if not starting to improve in a week or if worsening   ? ?

## 2021-09-23 NOTE — Assessment & Plan Note (Signed)
Reviewed health habits including diet and exercise and skin cancer prevention ?Reviewed appropriate screening tests for age  ?Also reviewed health mt list, fam hx and immunization status , as well as social and family history   ?See HPI ?Labs reviewed  ?dexa ordered ?Colonoscopy ordered ?Declines shingirx vaccine  ?Mammogram utd ?Pap utd  ?Taking vit D /enc exercise  ?

## 2021-09-23 NOTE — Assessment & Plan Note (Signed)
Hypothyroidism  ?Pt has no clinical changes ?No change in energy level/ hair or skin/ edema and no tremor ?Lab Results  ?Component Value Date  ? TSH 2.49 09/16/2021  ?  ?Plan to continue levothyroxine 100 mcg daily ?

## 2021-09-23 NOTE — Assessment & Plan Note (Signed)
Due for 2 y dexa, pt wants to do this in the fall with her next mammogram ?Order is in  ?Tolerating boniva since 2020 ?No falls or fx ?Continues D3 ?Also walking  ?

## 2021-10-31 ENCOUNTER — Ambulatory Visit (AMBULATORY_SURGERY_CENTER): Payer: Self-pay | Admitting: *Deleted

## 2021-10-31 VITALS — Ht 59.0 in | Wt 149.0 lb

## 2021-10-31 DIAGNOSIS — Z800 Family history of malignant neoplasm of digestive organs: Secondary | ICD-10-CM

## 2021-10-31 DIAGNOSIS — Z86010 Personal history of colonic polyps: Secondary | ICD-10-CM

## 2021-10-31 MED ORDER — NA SULFATE-K SULFATE-MG SULF 17.5-3.13-1.6 GM/177ML PO SOLN: 1.0000 | Freq: Once | ORAL | 0 refills | Status: AC

## 2021-10-31 NOTE — Progress Notes (Signed)
No egg or soy allergy known to patient  issues known to pt with past sedation with any surgeries or procedures of PONV 1988 Patient denies ever being told they had issues or difficulty with intubation  No FH of Malignant Hyperthermia Pt is not on diet pills Pt is not on  home 02  Pt is not on blood thinners  Pt denies issues with constipation  No A fib or A flutter

## 2021-11-05 ENCOUNTER — Other Ambulatory Visit: Payer: Self-pay

## 2021-11-05 MED ORDER — IBANDRONATE SODIUM 150 MG PO TABS: 150.0000 mg | ORAL_TABLET | ORAL | 3 refills | Status: DC

## 2021-11-28 ENCOUNTER — Encounter: Payer: Self-pay | Admitting: Gastroenterology

## 2021-11-28 ENCOUNTER — Ambulatory Visit (AMBULATORY_SURGERY_CENTER): Payer: Self-pay | Admitting: Gastroenterology

## 2021-11-28 VITALS — BP 90/53 | HR 72 | Temp 98.0°F | Resp 17 | Ht 59.0 in | Wt 149.0 lb

## 2021-11-28 DIAGNOSIS — Z09 Encounter for follow-up examination after completed treatment for conditions other than malignant neoplasm: Secondary | ICD-10-CM

## 2021-11-28 DIAGNOSIS — Z800 Family history of malignant neoplasm of digestive organs: Secondary | ICD-10-CM

## 2021-11-28 DIAGNOSIS — Z86010 Personal history of colonic polyps: Secondary | ICD-10-CM

## 2021-11-28 MED ORDER — SODIUM CHLORIDE 0.9 % IV SOLN: 500.0000 mL | Freq: Once | INTRAVENOUS | Status: DC

## 2021-11-28 MED ORDER — FLEET ENEMA 7-19 GM/118ML RE ENEM
1.0000 | ENEMA | Freq: Once | RECTAL | Status: AC
  Administered 2021-11-28: 1 via RECTAL

## 2021-11-28 NOTE — Progress Notes (Signed)
[facility] History and Physical   Primary Care Physician:  Tower, Wynelle Fanny, MD   Reason for Procedure:  History of adenomatous colon polyps  Plan:    Surveillance colonoscopy with possible interventions as needed     HPI: Debra Ford is a very pleasant 66 y.o. female here for surveillance colonoscopy. Denies any nausea, vomiting, abdominal pain, melena or bright red blood per rectum  The risks and benefits as well as alternatives of endoscopic procedure(s) have been discussed and reviewed. All questions answered. The patient agrees to proceed.    Past Medical History:  Diagnosis Date   Allergy    seasonal   Cancer ([place])    skin cancer on nose   Cataract    removed both eyes   Elevated liver function tests    Hearing loss    Hyperthyroidism    OCD (obsessive compulsive disorder)    Osteopenia    PONV (postoperative nausea and vomiting)    1988 with breast implants   Turner's syndrome     Past Surgical History:  Procedure Laterality Date   AUGMENTATION MAMMAPLASTY     BREAST ENHANCEMENT SURGERY  1988   bilateral   COCHLEAR IMPLANT     COLONOSCOPY     2013   DILATION AND CURETTAGE OF UTERUS     maxum  2012   hearing surgery   POLYPECTOMY      Prior to Admission medications   Medication Sig Start Date End Date Taking? Authorizing Provider  Ascorbic Acid (VITAMIN C) 1000 MG tablet Take 1,000 mg by mouth daily.   Yes [provider]  CALCIUM CITRATE-VITAMIN D PO Take 2 tablets by mouth daily.   Yes [provider]  fexofenadine (ALLEGRA) 180 MG tablet Take 180 mg by mouth daily.   Yes [provider]  FLUoxetine (PROZAC) 40 MG capsule Take 1 capsule (40 mg total) by mouth daily. 09/23/21  Yes Tower, Wynelle Fanny, MD  fluticasone (FLONASE) 50 MCG/ACT nasal spray Place into both nostrils daily.   Yes [provider]  folic acid (FOLVITE) 1 MG tablet Take by mouth.   Yes [provider]  levothyroxine  (SYNTHROID) 100 MCG tablet Take 1 tablet (100 mcg total) by mouth daily. 09/23/21  Yes Tower, Wynelle Fanny, MD  Multiple Vitamin (MULTIVITAMIN) tablet Take 1 tablet by mouth daily.   Yes [provider]  Vitamin D, Ergocalciferol, 2000 units CAPS Take 1 capsule by mouth daily.   Yes [provider]  VITAMIN E PO Take by mouth.   Yes [provider]  ALPRAZolam Duanne Moron) 0.5 MG tablet Take 0.5-1 tablets (0.25-0.5 mg total) by mouth daily as needed for anxiety. Patient not taking: Reported on 10/31/2021 09/23/21   Tower, Wynelle Fanny, MD  ibandronate (BONIVA) 150 MG tablet Take 1 tablet (150 mg total) by mouth every 30 (thirty) days. Take in the morning with a full glass of water, on an empty stomach, and do not take anything else by mouth or lie down for the next 30 min. 11/05/21   Tower, Wynelle Fanny, MD  ketoconazole (NIZORAL) 2 % cream Apply 1 application. topically daily. To affected area/rash 09/23/21   Tower, Wynelle Fanny, MD    Current Outpatient Medications  Medication Sig Dispense Refill   Ascorbic Acid (VITAMIN C) 1000 MG tablet Take 1,000 mg by mouth daily.     CALCIUM CITRATE-VITAMIN D PO Take 2 tablets by mouth daily.     fexofenadine (ALLEGRA) 180 MG tablet Take 180  mg by mouth daily.     FLUoxetine (PROZAC) 40 MG capsule Take 1 capsule (40 mg total) by mouth daily. 90 capsule 3   fluticasone (FLONASE) 50 MCG/ACT nasal spray Place into both nostrils daily.     folic acid (FOLVITE) 1 MG tablet Take by mouth.     levothyroxine (SYNTHROID) 100 MCG tablet Take 1 tablet (100 mcg total) by mouth daily. 90 tablet 3   Multiple Vitamin (MULTIVITAMIN) tablet Take 1 tablet by mouth daily.     Vitamin D, Ergocalciferol, 2000 units CAPS Take 1 capsule by mouth daily.     VITAMIN E PO Take by mouth.     ALPRAZolam (XANAX) 0.5 MG tablet Take 0.5-1 tablets (0.25-0.5 mg total) by mouth daily as needed for anxiety. (Patient not taking: Reported on 10/31/2021) 15 tablet 0   ibandronate (BONIVA) 150  MG tablet Take 1 tablet (150 mg total) by mouth every 30 (thirty) days. Take in the morning with a full glass of water, on an empty stomach, and do not take anything else by mouth or lie down for the next 30 min. 3 tablet 3   ketoconazole (NIZORAL) 2 % cream Apply 1 application. topically daily. To affected area/rash 30 g 0   Current Facility-Administered Medications  Medication Dose Route Frequency Provider Last Rate Last Admin   0.9 %  sodium chloride infusion  500 mL Intravenous Once Mauri Pole, MD        Allergies as of 11/28/2021 - Review Complete 11/28/2021  Allergen Reaction Noted   Tetanus toxoids  07/02/2012    Family History  Problem Relation Age of Onset   Colon cancer Mother 69   Diabetes Father    Colon polyps Father 41   Heart disease Father    Prostate cancer Father    Diabetes Maternal Grandmother    Diabetes Paternal Grandmother    Diabetes Paternal Grandfather    Stomach cancer Neg Hx    Esophageal cancer Neg Hx    Rectal cancer Neg Hx    Breast cancer Neg Hx     Social History   Socioeconomic History   Marital status: Married    Spouse name: Not on file   Number of children: Not on file   Years of education: Not on file   Highest education level: Not on file  Occupational History   Not on file  Tobacco Use   Smoking status: Never   Smokeless tobacco: Never  Substance and Sexual Activity   Alcohol use: No    Alcohol/week: 0.0 standard drinks of alcohol   Drug use: No   Sexual activity: Not on file  Other Topics Concern   Not on file  Social History Narrative   Not on file   Social Determinants of Health   Financial Resource Strain: Not on file  Food Insecurity: Not on file  Transportation Needs: Not on file  Physical Activity: Not on file  Stress: Not on file  Social Connections: Not on file  Intimate Partner Violence: Not on file    Review of Systems:  All other review of systems negative except as mentioned in the  HPI.  Physical Exam: Vital signs in last 24 hours: BP (!) 122/59   Pulse 72   Temp 98 F (36.7 C)   Ht '4\' 11"'$  (1.499 m)   Wt 149 lb (67.6 kg)   SpO2 100%   BMI 30.09 kg/m  General:   Alert, NAD Lungs:  Clear .   Heart:  Regular rate and rhythm Abdomen:  Soft, nontender and nondistended. Neuro/Psych:  Alert and cooperative. Normal mood and affect. A and O x 3  Reviewed labs, radiology imaging, old records and pertinent past GI work up  Patient is appropriate for planned procedure(s) and anesthesia in an ambulatory setting   K. Denzil Magnuson , MD [phone]

## 2021-11-28 NOTE — Progress Notes (Signed)
Patient self-administered fleets enema, result is clear per Samantha Crimes, RN

## 2021-11-28 NOTE — Op Note (Addendum)
[place] Patient Name: Debra Ford Procedure Date: 11/28/2021 7:04 AM MRN: [id number] Endoscopist: Mauri Pole , MD Age: 66 Referring MD:  Date of Birth: [birth year] Gender: Female Account #: [id number] Procedure:                Colonoscopy Indications:              Screening for colorectal malignant neoplasm,                            Screening patient at increased risk: Family history                            of 1st-degree relative with colorectal cancer at                            age 63 years (or older) Medicines:                Monitored Anesthesia Care Procedure:                Pre-Anesthesia Assessment:                           - Prior to the procedure, a History and Physical                            was performed, and patient medications and                            allergies were reviewed. The patient's tolerance of                            previous anesthesia was also reviewed. The risks                            and benefits of the procedure and the sedation                            options and risks were discussed with the patient.                            All questions were answered, and informed consent                            was obtained. Prior Anticoagulants: The patient has                            taken no previous anticoagulant or antiplatelet                            agents. ASA Grade Assessment: II - A patient with                            mild systemic disease. After reviewing the risks  and benefits, the patient was deemed in                            satisfactory condition to undergo the procedure.                           After obtaining informed consent, the colonoscope                            was passed under direct vision. Throughout the                            procedure, the patient's blood pressure, pulse, and                            oxygen saturations were monitored  continuously. The                            Olympus [id number]-[id number] (#[id number]) Colonoscope was                            introduced through the anus and advanced to the the                            cecum, identified by appendiceal orifice and                            ileocecal valve. The colonoscopy was performed                            without difficulty. The patient tolerated the                            procedure well. The quality of the bowel                            preparation was adequate. The ileocecal valve,                            appendiceal orifice, and rectum were photographed. Scope In: 8:16:17 AM Scope Out: 8:30:01 AM Scope Withdrawal Time: 0 hours 7 minutes 41 seconds  Total Procedure Duration: 0 hours 13 minutes 44 seconds  Findings:                 The perianal and digital rectal examinations were                            normal.                           Scattered small-mouthed diverticula were found in                            the sigmoid colon, descending colon, transverse  colon and ascending colon.                           External and internal hemorrhoids were found during                            retroflexion. The hemorrhoids were medium-sized. Complications:            No immediate complications. Estimated Blood Loss:     Estimated blood loss was minimal. Impression:               - Diverticulosis in the sigmoid colon, in the                            descending colon, in the transverse colon and in                            the ascending colon.                           - External and internal hemorrhoids.                           - No specimens collected. Recommendation:           - Patient has a contact number available for                            emergencies. The signs and symptoms of potential                            delayed complications were discussed with the                            patient. Return  to normal activities tomorrow.                            Written discharge instructions were provided to the                            patient.                           - Resume previous diet.                           - Continue present medications.                           - Repeat colonoscopy in 5 years for surveillance. Mauri Pole, MD 11/28/2021 8:40:56 AM This report has been signed electronically.

## 2021-11-28 NOTE — Progress Notes (Signed)
Pt's states no medical or surgical changes since previsit or office visit. 

## 2021-11-28 NOTE — Patient Instructions (Addendum)
Handout on hemorrhoids and diverticulosis given to patient. Resume previous diet and continue present medications. Repeat colonoscopy in 10 years for surveillance!  YOU HAD AN ENDOSCOPIC PROCEDURE TODAY AT [facility]:   Refer to the procedure report that was given to you for any specific questions about what was found during the examination.  If the procedure report does not answer your questions, please call your gastroenterologist to clarify.  If you requested that your care partner not be given the details of your procedure findings, then the procedure report has been included in a sealed envelope for you to review at your convenience later.  YOU SHOULD EXPECT: Some feelings of bloating in the abdomen. Passage of more gas than usual.  Walking can help get rid of the air that was put into your GI tract during the procedure and reduce the bloating. If you had a lower endoscopy (such as a colonoscopy or flexible sigmoidoscopy) you may notice spotting of blood in your stool or on the toilet paper. If you underwent a bowel prep for your procedure, you may not have a normal bowel movement for a few days.  Please Note:  You might notice some irritation and congestion in your nose or some drainage.  This is from the oxygen used during your procedure.  There is no need for concern and it should clear up in a day or so.  SYMPTOMS TO REPORT IMMEDIATELY:  Following lower endoscopy (colonoscopy or flexible sigmoidoscopy):  Excessive amounts of blood in the stool  Significant tenderness or worsening of abdominal pains  Swelling of the abdomen that is new, acute  Fever of 100F or higher  For urgent or emergent issues, a gastroenterologist can be reached at any hour by calling [phone]. Do not use MyChart messaging for urgent concerns.    DIET:  We do recommend a small meal at first, but then you may proceed to your regular diet.  Drink plenty of fluids but you should avoid  alcoholic beverages for 24 hours.  ACTIVITY:  You should plan to take it easy for the rest of today and you should NOT DRIVE or use heavy machinery until tomorrow (because of the sedation medicines used during the test).    FOLLOW UP: Our staff will call the number listed on your records the next business day following your procedure.  We will call around 7:15- 8:00 am to check on you and address any questions or concerns that you may have regarding the information given to you following your procedure. If we do not reach you, we will leave a message.  If you develop any symptoms (ie: fever, flu-like symptoms, shortness of breath, cough etc.) before then, please call [phone].  If you test positive for Covid 19 in the 2 weeks post procedure, please call and report this information to [place].    If any biopsies were taken you will be contacted by phone or by letter within the next 1-3 weeks.  Please call us at [phone] if you have not heard about the biopsies in 3 weeks.    SIGNATURES/CONFIDENTIALITY: You and/or your care partner have signed paperwork which will be entered into your electronic medical record.  These signatures attest to the fact that that the information above on your After Visit Summary has been reviewed and is understood.  Full responsibility of the confidentiality of this discharge information lies with you and/or your care-partner.

## 2021-11-28 NOTE — Progress Notes (Signed)
Report to PACU, RN, vss, BBS= Clear.  

## 2021-11-29 ENCOUNTER — Telehealth: Payer: Self-pay

## 2021-11-29 NOTE — Telephone Encounter (Signed)
  Follow up Call-     11/28/2021    7:12 AM  Call back number  Post procedure Call Back phone  # [phone]  Permission to leave phone message Yes     Patient questions:  Do you have a fever, pain , or abdominal swelling? No. Pain Score  0 *  Have you tolerated food without any problems? Yes.    Have you been able to return to your normal activities? Yes.    Do you have any questions about your discharge instructions: Diet   No. Medications  No. Follow up visit  No.  Do you have questions or concerns about your Care? No.  Actions: * If pain score is 4 or above: No action needed, pain <4.

## 2022-03-27 ENCOUNTER — Ambulatory Visit: Payer: Self-pay

## 2022-03-27 ENCOUNTER — Ambulatory Visit
Admission: RE | Admit: 2022-03-27 | Discharge: 2022-03-27 | Disposition: A | Discharge status: Home / Self Care | Payer: No Typology Code available for payment source | Source: Ambulatory Visit | Attending: Family Medicine | Admitting: Family Medicine

## 2022-03-27 ENCOUNTER — Ambulatory Visit: Payer: No Typology Code available for payment source

## 2022-03-27 DIAGNOSIS — Z1231 Encounter for screening mammogram for malignant neoplasm of breast: Secondary | ICD-10-CM

## 2022-03-27 DIAGNOSIS — E2839 Other primary ovarian failure: Secondary | ICD-10-CM

## 2022-04-05 IMAGING — MG DIGITAL SCREENING BREAST BILAT IMPLANT W/ TOMO W/ CAD
8 of 12 series · 8 of 28 positions shown · non-contrast
Comparison: Previous exam(s).

CLINICAL DATA: Screening.

EXAM:
DIGITAL SCREENING BILATERAL MAMMOGRAM WITH IMPLANTS, CAD AND TOMO
The patient has retropectoral implants. Standard and implant
displaced views were performed.

[R MLO]
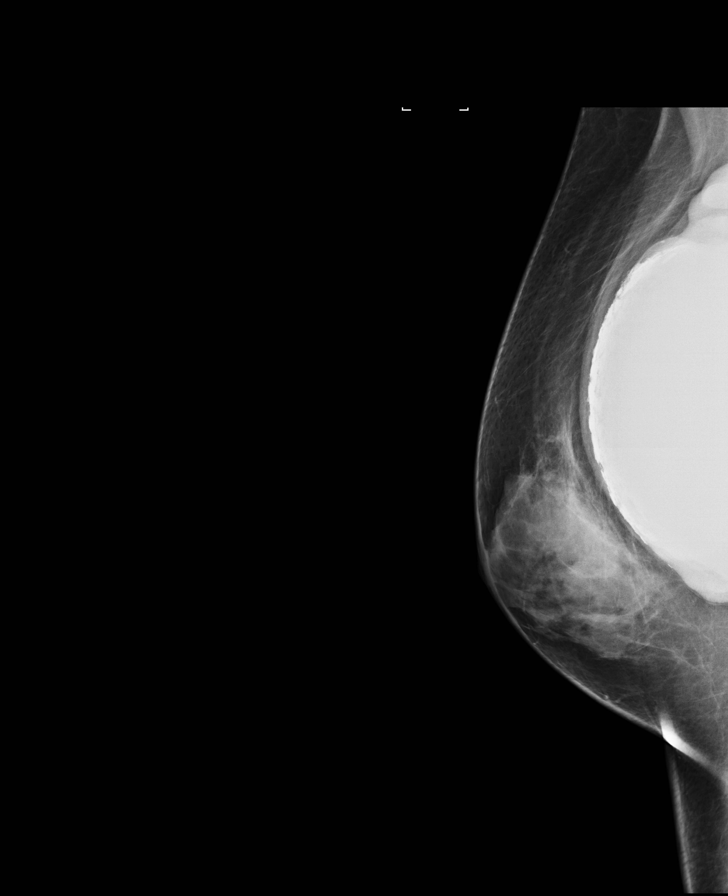

[L MLO]
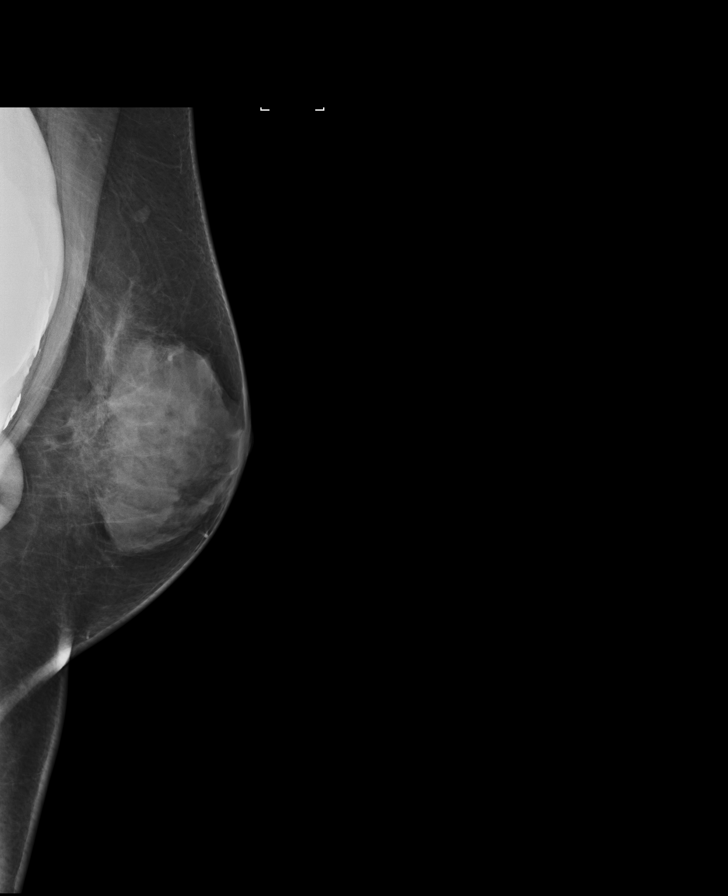

[R CC]
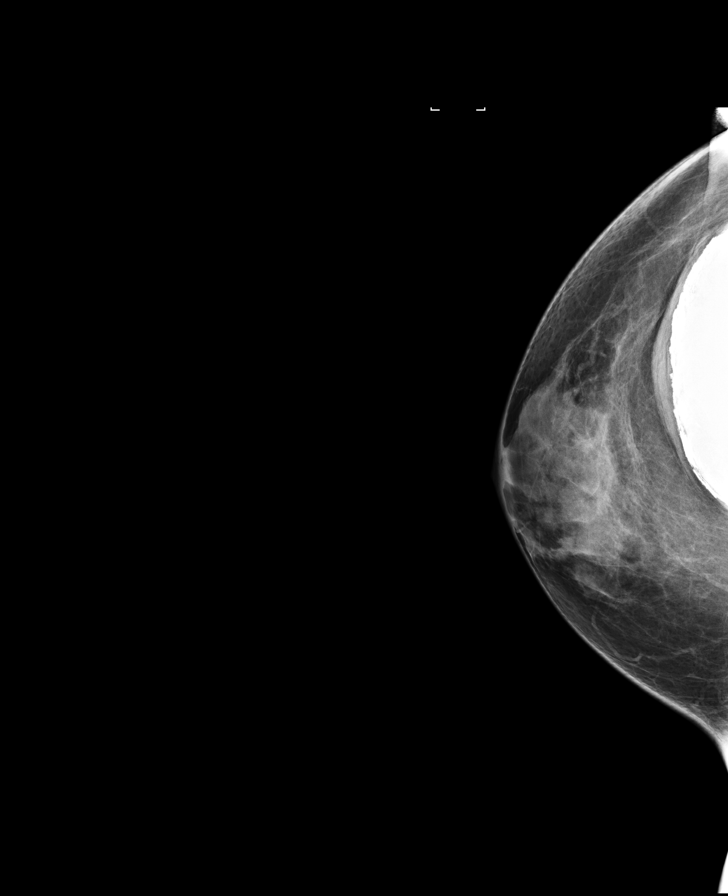

[L CC]
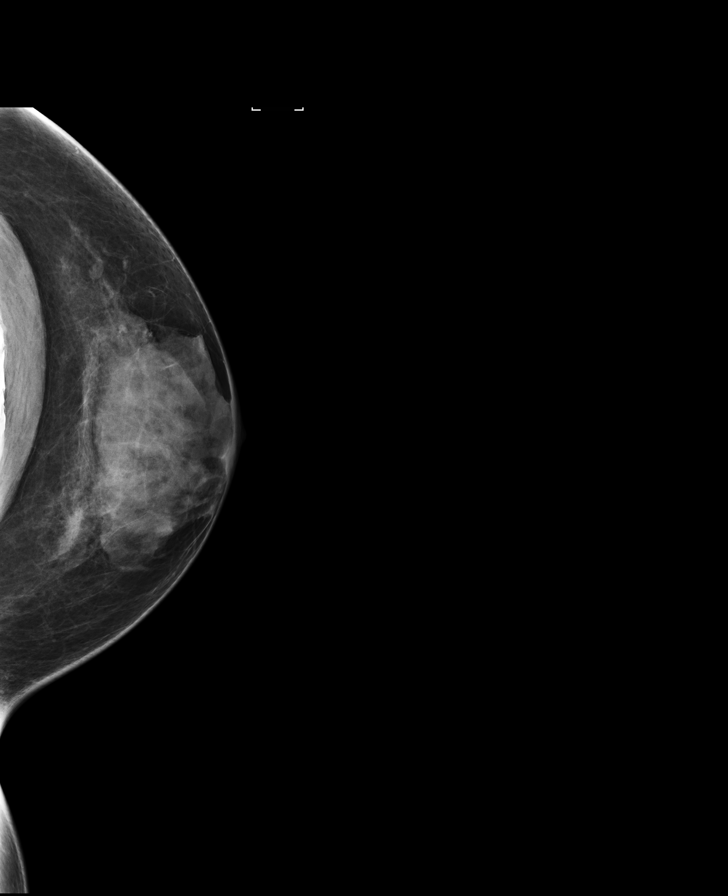

[R MLO synth-2D]
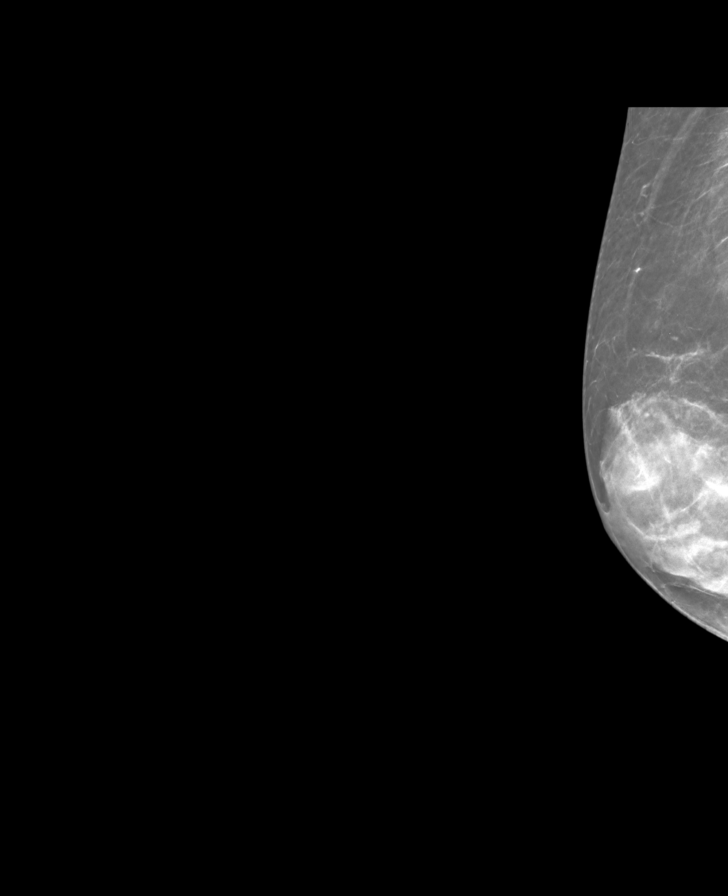

[L MLO synth-2D]
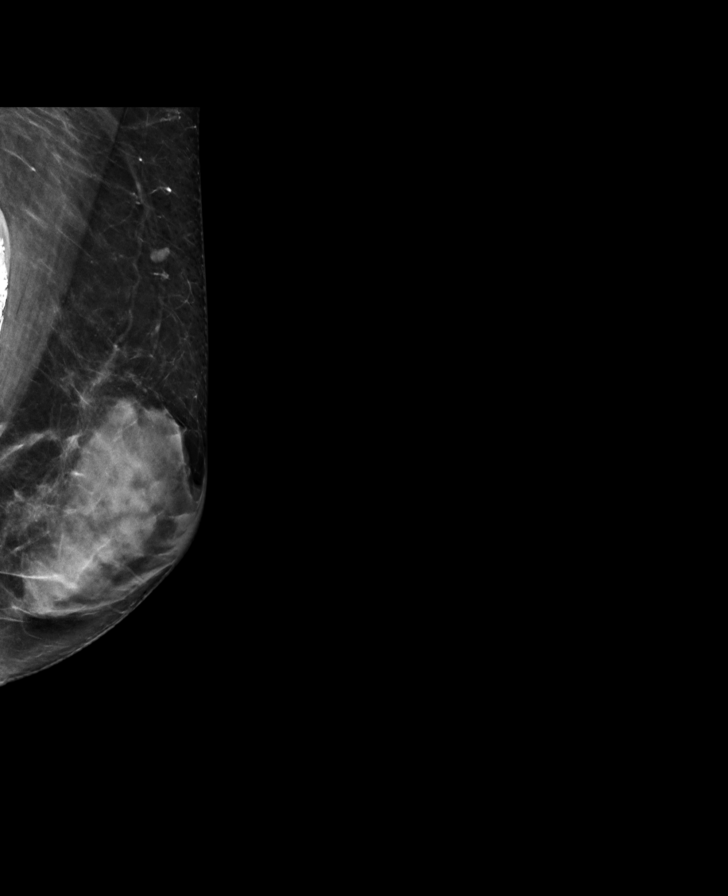

[L CC synth-2D]
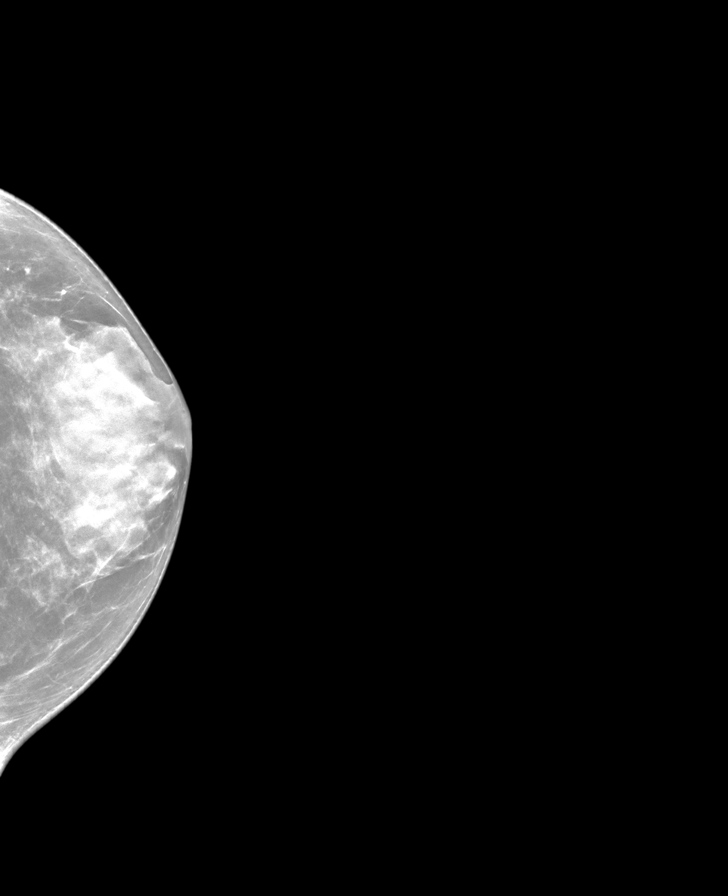

[R CC synth-2D]
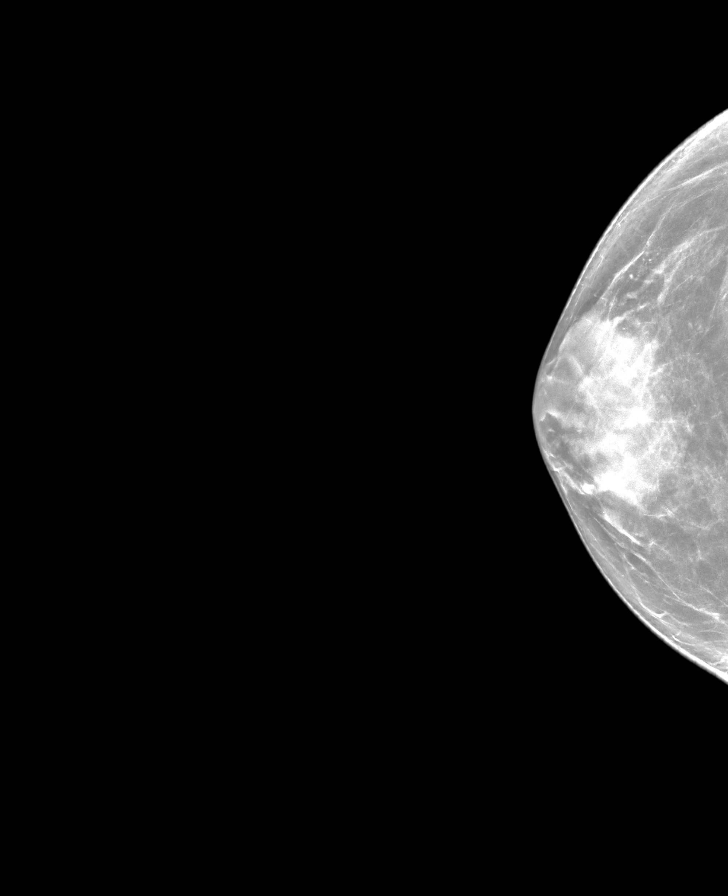

[8 of 28 positions shown; findings below may reference images not displayed]

ACR Breast Density Category d: The breast tissue is extremely dense,
which lowers the sensitivity of mammography.
FINDINGS: There are no findings suspicious for malignancy. Contour
abnormalities noted of the implants bilaterally consistent with
extracapsular rupture. Images were processed with CAD.
IMPRESSION: 1. No mammographic evidence of malignancy. A result letter of this
screening mammogram will be mailed directly to the patient.

2.  Bilateral extracapsular implant rupture.

RECOMMENDATION:
Screening mammogram in one year. (Code:PT-A-0N1)

BI-RADS CATEGORY  1:  Negative.

## 2022-05-12 HISTORY — PX: COCHLEAR IMPLANT: SUR684

## 2022-06-29 ENCOUNTER — Other Ambulatory Visit: Payer: Self-pay | Admitting: Family Medicine

## 2022-06-30 ENCOUNTER — Telehealth: Payer: Self-pay | Admitting: Family Medicine

## 2022-06-30 NOTE — Telephone Encounter (Signed)
Prescription Request  06/30/2022  Is this a "Controlled Substance" medicine? No  LOV: 09/23/2021  What is the name of the medication or equipment? ALPRAZolam (XANAX) 0.5 MG tablet   Have you contacted your pharmacy to request a refill? No   Which pharmacy would you like this sent to?  [place], [place] - [address] [place] [address] Phone: [phone] Fax: [phone]    Patient notified that their request is being sent to the clinical staff for review and that they should receive a response within 2 business days.   Please advise at [facility] [phone]

## 2022-06-30 NOTE — Telephone Encounter (Signed)
See refill request from pharmacy 

## 2022-06-30 NOTE — Telephone Encounter (Signed)
Name of Medication: Xanax Name of Pharmacy: [facility] or Written Date and Quantity: 09/23/21 #15 tab/ 0 refill Last Office Visit and Type: CPE 09/23/21 Next Office Visit and Type: CPE 09/15/22

## 2022-09-07 ENCOUNTER — Telehealth: Payer: Self-pay | Admitting: Family Medicine

## 2022-09-07 DIAGNOSIS — Z0000 Encounter for general adult medical examination without abnormal findings: Secondary | ICD-10-CM

## 2022-09-07 DIAGNOSIS — E038 Other specified hypothyroidism: Secondary | ICD-10-CM

## 2022-09-07 DIAGNOSIS — M810 Age-related osteoporosis without current pathological fracture: Secondary | ICD-10-CM

## 2022-09-07 DIAGNOSIS — R748 Abnormal levels of other serum enzymes: Secondary | ICD-10-CM

## 2022-09-07 NOTE — Telephone Encounter (Signed)
-----   Message from Alvina Chou sent at 08/18/2022  4:22 PM EDT ----- Regarding: Lab orders for Monday, 4.29.24 Patient is scheduled for CPX labs, please order future labs, Thanks , Camelia Eng

## 2022-09-08 ENCOUNTER — Other Ambulatory Visit (INDEPENDENT_AMBULATORY_CARE_PROVIDER_SITE_OTHER): Payer: Self-pay

## 2022-09-08 DIAGNOSIS — M810 Age-related osteoporosis without current pathological fracture: Secondary | ICD-10-CM

## 2022-09-08 DIAGNOSIS — R748 Abnormal levels of other serum enzymes: Secondary | ICD-10-CM

## 2022-09-08 DIAGNOSIS — E038 Other specified hypothyroidism: Secondary | ICD-10-CM

## 2022-09-08 DIAGNOSIS — Z0000 Encounter for general adult medical examination without abnormal findings: Secondary | ICD-10-CM

## 2022-09-08 LAB — CBC WITH DIFFERENTIAL/PLATELET
Basophils Absolute: 0 10*3/uL (ref 0.0–0.1)
Basophils Relative: 0.6 % (ref 0.0–3.0)
Eosinophils Absolute: 0.1 10*3/uL (ref 0.0–0.7)
Eosinophils Relative: 1.3 % (ref 0.0–5.0)
HCT: 40.3 % (ref 36.0–46.0)
Hemoglobin: 13.7 g/dL (ref 12.0–15.0)
Lymphocytes Relative: 25.8 % (ref 12.0–46.0)
Lymphs Abs: 1.9 10*3/uL (ref 0.7–4.0)
MCHC: 34.1 g/dL (ref 30.0–36.0)
MCV: 95.2 fl (ref 78.0–100.0)
Monocytes Absolute: 0.7 10*3/uL (ref 0.1–1.0)
Monocytes Relative: 10.1 % (ref 3.0–12.0)
Neutro Abs: 4.5 10*3/uL (ref 1.4–7.7)
Neutrophils Relative %: 62.2 % (ref 43.0–77.0)
Platelets: 224 10*3/uL (ref 150.0–400.0)
RBC: 4.23 Mil/uL (ref 3.87–5.11)
RDW: 12.3 % (ref 11.5–15.5)
WBC: 7.3 10*3/uL (ref 4.0–10.5)

## 2022-09-08 LAB — COMPREHENSIVE METABOLIC PANEL
ALT: 30 U/L (ref 0–35)
AST: 21 U/L (ref 0–37)
Albumin: 4.2 g/dL (ref 3.5–5.2)
Alkaline Phosphatase: 141 U/L — ABNORMAL HIGH (ref 39–117)
BUN: 11 mg/dL (ref 6–23)
CO2: 29 mEq/L (ref 19–32)
Calcium: 9.4 mg/dL (ref 8.4–10.5)
Chloride: 100 mEq/L (ref 96–112)
Creatinine, Ser: 0.7 mg/dL (ref 0.40–1.20)
GFR: 91.19 mL/min (ref >60.00)
Glucose, Bld: 99 mg/dL (ref 70–99)
Potassium: 4.5 mEq/L (ref 3.5–5.1)
Sodium: 137 mEq/L (ref 135–145)
Total Bilirubin: 0.8 mg/dL (ref 0.2–1.2)
Total Protein: 6.1 g/dL (ref 6.0–8.3)

## 2022-09-08 LAB — LIPID PANEL
Cholesterol: 179 mg/dL (ref 0–200)
HDL: 68.3 mg/dL (ref >39.00)
LDL Cholesterol: 84 mg/dL (ref 0–99)
NonHDL: 111
Total CHOL/HDL Ratio: 3
Triglycerides: 135 mg/dL (ref 0.0–149.0)
VLDL: 27 mg/dL (ref 0.0–40.0)

## 2022-09-08 LAB — VITAMIN D 25 HYDROXY (VIT D DEFICIENCY, FRACTURES): VITD: 58.64 ng/mL (ref 30.00–100.00)

## 2022-09-08 LAB — TSH: TSH: 1.99 u[IU]/mL (ref 0.35–5.50)

## 2022-09-15 ENCOUNTER — Ambulatory Visit (INDEPENDENT_AMBULATORY_CARE_PROVIDER_SITE_OTHER): Payer: Self-pay | Admitting: Family Medicine

## 2022-09-15 ENCOUNTER — Encounter: Payer: Self-pay | Admitting: Family Medicine

## 2022-09-15 VITALS — BP 100/64 | HR 94 | Temp 97.9°F | Ht 59.5 in | Wt 146.0 lb

## 2022-09-15 DIAGNOSIS — Z0000 Encounter for general adult medical examination without abnormal findings: Secondary | ICD-10-CM

## 2022-09-15 DIAGNOSIS — E038 Other specified hypothyroidism: Secondary | ICD-10-CM

## 2022-09-15 DIAGNOSIS — Q969 Turner's syndrome, unspecified: Secondary | ICD-10-CM

## 2022-09-15 DIAGNOSIS — M810 Age-related osteoporosis without current pathological fracture: Secondary | ICD-10-CM

## 2022-09-15 DIAGNOSIS — F429 Obsessive-compulsive disorder, unspecified: Secondary | ICD-10-CM

## 2022-09-15 DIAGNOSIS — H9193 Unspecified hearing loss, bilateral: Secondary | ICD-10-CM

## 2022-09-15 MED ORDER — FLUOXETINE HCL 40 MG PO CAPS: 40.0000 mg | ORAL_CAPSULE | Freq: Every day | ORAL | 3 refills | Status: DC

## 2022-09-15 MED ORDER — LEVOTHYROXINE SODIUM 100 MCG PO TABS: 100.0000 ug | ORAL_TABLET | Freq: Every day | ORAL | 3 refills | Status: DC

## 2022-09-15 MED ORDER — ALPRAZOLAM 0.5 MG PO TABS: ORAL_TABLET | ORAL | 0 refills | Status: DC

## 2022-09-15 NOTE — Assessment & Plan Note (Signed)
No new developments Had echo in 2017  No cardiac symptoms

## 2022-09-15 NOTE — Assessment & Plan Note (Signed)
Reviewed health habits including diet and exercise and skin cancer prevention Reviewed appropriate screening tests for age  Also reviewed health mt list, fam hx and immunization status , as well as social and family history   See HPI Labs reviewed and ordered Declines shingrix/ flu and other vaccines Is allergic to tetanus vaccine Mammogram utd 03/2022 Pap udt 07/2018 normal with neg HPV Colonoscopy 11/2021 - ? 5 or 10 y recall dep on fam hx  Dexa 03/2022 stable  PHQ of 0

## 2022-09-15 NOTE — Assessment & Plan Note (Addendum)
Stable and well controlled Continues fluoxetine 40 mg daily  Xanax sparingly for emergencies  Good self care

## 2022-09-15 NOTE — Patient Instructions (Addendum)
.  Add some strength training to your routine, this is important for bone and brain health and can reduce your risk of falls and help your body use insulin properly and regulate weight  Light weights, exercise bands , and internet videos are a good way to start  Yoga (chair or regular), machines , floor exercises or a gym with machines are also good options   Keep walking / stay active - but do add that   Take care of yourself  Eat a diet low in sugar and processed foods   Use sun protection

## 2022-09-15 NOTE — Progress Notes (Signed)
Subjective:    Patient ID: Debra Ford, female    DOB: Nov 12, 1955, 67 y.o.   MRN: [id number]  HPI Here for health maintenance exam and to review chronic medical problems    Wt Readings from Last 3 Encounters:  09/15/22 146 lb (66.2 kg)  11/28/21 149 lb (67.6 kg)  10/31/21 149 lb (67.6 kg)   28.99 kg/m  Vitals:   09/15/22 0800  BP: 100/64  Pulse: 94  Temp: 97.9 F (36.6 C)  SpO2: 98%   Lost father this year -lung cancer Lost brother to MI at age 78 as well - had heavy smoking and cirrhosis as well  Tough year   Feeling good overall   Taking care of herself   Planning cochlear implant in L ear in July     Immunization History  Administered Date(s) Administered   Influenza Split 04/21/2011, 02/18/2013, 01/11/2018   Influenza Whole 02/23/2002, 02/13/2009, 02/27/2010, 02/20/2012   Influenza,inj,Quad PF,6+ Mos 01/13/2017, 01/12/2018   Influenza-Unspecified 03/17/2014, 03/01/2015, 01/25/2016   Pneumococcal Polysaccharide-23 01/13/2017   Td 07/12/2002   Tdap 06/30/2012   Zoster, Live 07/15/2016   Health Maintenance Due  Topic Date Due   Zoster Vaccines- Shingrix (1 of 2) Never done   DTaP/Tdap/Td (3 - Td or Tdap) 06/30/2022   Shingrix- declines   Tdap 06/2012- declines / is allergic   For this reason she declines   Mammogram 03/2022 Self breast exam: no lumps or changes   Pap 07/2018  Normal with neg HPV  No gyn complaints  But does have some urinary frequency at night   No incontinence  Tolerates it ok - not ready for treatment    Colonoscopy 11/2021  Mother had colon cancer at 9 and father at 19    .Dexa  03/2022 = stable  Osteoporosis  On boniva since 2020  Falls-none  Fractures-none Supplements  vit D 200 iu daily  Exercise -sporatic walking Last vitamin D Lab Results  Component Value Date   VD25OH 58.64 09/08/2022      Mood H/o OCD On fluoxetine 40 mg daily      09/15/2022    8:00 AM 09/23/2021   10:02 AM 09/21/2020   10:07  AM 09/20/2019   11:16 AM 07/16/2018    9:29 AM  Depression screen PHQ 2/9  Decreased Interest 0 0 0 0 0  Down, Depressed, Hopeless 0 0 0 0 0  PHQ - 2 Score 0 0 0 0 0  Altered sleeping   0 0   Tired, decreased energy   0 0   Change in appetite   0 0   Feeling bad or failure about yourself    0 0   Trouble concentrating   0 0   Moving slowly or fidgety/restless   0 0   Suicidal thoughts   0 0   PHQ-9 Score   0 0   Difficult doing work/chores   Not difficult at all Not difficult at all      Has Turner's syndrome  No cardiac issues Last echol 2017      Hypothyroidism  Pt has no clinical changes No change in energy level/ hair or skin/ edema and no tremor Lab Results  Component Value Date   TSH 1.99 09/08/2022     Levothyroxine 100 mcg daily     Cholesterol  Lab Results  Component Value Date   CHOL 179 09/08/2022   CHOL 198 09/16/2021   CHOL 215 (H) 09/14/2020   Lab Results  Component Value Date   HDL 68.30 09/08/2022   HDL 71.00 09/16/2021   HDL 73.80 09/14/2020   Lab Results  Component Value Date   LDLCALC 84 09/08/2022   LDLCALC 99 09/16/2021   LDLCALC 109 (H) 09/14/2020   Lab Results  Component Value Date   TRIG 135.0 09/08/2022   TRIG 137.0 09/16/2021   TRIG 163.0 (H) 09/14/2020   Lab Results  Component Value Date   CHOLHDL 3 09/08/2022   CHOLHDL 3 09/16/2021   CHOLHDL 3 09/14/2020   No results found for: "LDLDIRECT" Stable  Eats well most of the time    Other labs Last metabolic panel Lab Results  Component Value Date   GLUCOSE 99 09/08/2022   NA 137 09/08/2022   K 4.5 09/08/2022   CL 100 09/08/2022   CO2 29 09/08/2022   BUN 11 09/08/2022   CREATININE 0.70 09/08/2022   CALCIUM 9.4 09/08/2022   PROT 6.1 09/08/2022   ALBUMIN 4.2 09/08/2022   BILITOT 0.8 09/08/2022   ALKPHOS 141 (H) 09/08/2022   AST 21 09/08/2022   ALT 30 09/08/2022   ANIONGAP 5 (L) 08/23/2012   Lab Results  Component Value Date   WBC 7.3 09/08/2022   HGB 13.7  09/08/2022   HCT 40.3 09/08/2022   MCV 95.2 09/08/2022   PLT 224.0 09/08/2022    Patient Active Problem List   Diagnosis Date Noted   Intertrigo 09/23/2021   Elevated liver enzymes 07/18/2018   Pre-operative examination 01/13/2017   Breast implant leak 08/06/2015   Encounter for routine gynecological examination 07/04/2015   Estrogen deficiency 07/04/2015   Routine general medical examination at a health care facility 06/25/2014   Family history of colon cancer 04/21/2011   Osteoporosis 12/29/2006   Hypothyroidism 11/30/2006   Obsessive-compulsive disorder 11/20/2006   Hearing loss 11/20/2006   HEMORRHOIDS, INTERNAL 11/20/2006   TURNER'S SYNDROME 11/20/2006   Past Medical History:  Diagnosis Date   Allergy    seasonal   Cancer (HCC)    skin cancer on nose   Cataract    removed both eyes   Elevated liver function tests    Hearing loss    Hyperthyroidism    OCD (obsessive compulsive disorder)    Osteopenia    PONV (postoperative nausea and vomiting)    1988 with breast implants   Turner's syndrome    Past Surgical History:  Procedure Laterality Date   AUGMENTATION MAMMAPLASTY     BREAST ENHANCEMENT SURGERY  1988   bilateral   COCHLEAR IMPLANT     COLONOSCOPY     2013   DILATION AND CURETTAGE OF UTERUS     maxum  2012   hearing surgery   POLYPECTOMY     Social History   Tobacco Use   Smoking status: Never   Smokeless tobacco: Never  Substance Use Topics   Alcohol use: No    Alcohol/week: 0.0 standard drinks of alcohol   Drug use: No   Family History  Problem Relation Age of Onset   Colon cancer Mother 60   Diabetes Father    Colon polyps Father 45   Heart disease Father    Prostate cancer Father    Lung cancer Father    Diabetes Maternal Grandmother    Diabetes Paternal Grandmother    Diabetes Paternal Grandfather    Stomach cancer Neg Hx    Esophageal cancer Neg Hx    Rectal cancer Neg Hx    Breast cancer Neg Hx  Allergies  Allergen  Reactions   Tetanus Toxoids     Hives (?)   Current Outpatient Medications on File Prior to Visit  Medication Sig Dispense Refill   Ascorbic Acid (VITAMIN C) 1000 MG tablet Take 1,000 mg by mouth daily.     CALCIUM CITRATE-VITAMIN D PO Take 2 tablets by mouth daily.     fexofenadine (ALLEGRA) 180 MG tablet Take 180 mg by mouth daily.     fluticasone (FLONASE) 50 MCG/ACT nasal spray Place into both nostrils daily.     folic acid (FOLVITE) 1 MG tablet Take by mouth.     ibandronate (BONIVA) 150 MG tablet Take 1 tablet (150 mg total) by mouth every 30 (thirty) days. Take in the morning with a full glass of water, on an empty stomach, and do not take anything else by mouth or lie down for the next 30 min. 3 tablet 3   Multiple Vitamin (MULTIVITAMIN) tablet Take 1 tablet by mouth daily.     Vitamin D, Ergocalciferol, 2000 units CAPS Take 1 capsule by mouth daily.     VITAMIN E PO Take by mouth.     No current facility-administered medications on file prior to visit.    Review of Systems  Constitutional:  Negative for activity change, appetite change, fatigue, fever and unexpected weight change.  HENT:  Negative for congestion, ear pain, rhinorrhea, sinus pressure and sore throat.   Eyes:  Negative for pain, redness and visual disturbance.  Respiratory:  Negative for cough, shortness of breath and wheezing.   Cardiovascular:  Negative for chest pain and palpitations.  Gastrointestinal:  Negative for abdominal pain, blood in stool, constipation and diarrhea.  Endocrine: Negative for polydipsia and polyuria.  Genitourinary:  Negative for dysuria, frequency and urgency.  Musculoskeletal:  Negative for arthralgias, back pain and myalgias.  Skin:  Negative for pallor and rash.  Allergic/Immunologic: Negative for environmental allergies.  Neurological:  Negative for dizziness, syncope and headaches.  Hematological:  Negative for adenopathy. Does not bruise/bleed easily.  Psychiatric/Behavioral:   Negative for decreased concentration and dysphoric mood. The patient is not nervous/anxious.        Objective:   Physical Exam Constitutional:      General: She is not in acute distress.    Appearance: Normal appearance. She is well-developed and normal weight. She is not ill-appearing or diaphoretic.  HENT:     Head: Normocephalic and atraumatic.     Right Ear: Tympanic membrane, ear canal and external ear normal.     Left Ear: Tympanic membrane, ear canal and external ear normal.     Nose: Nose normal. No congestion.     Mouth/Throat:     Mouth: Mucous membranes are moist.     Pharynx: Oropharynx is clear. No posterior oropharyngeal erythema.  Eyes:     General: No scleral icterus.    Extraocular Movements: Extraocular movements intact.     Conjunctiva/sclera: Conjunctivae normal.     Pupils: Pupils are equal, round, and reactive to light.  Neck:     Thyroid: No thyromegaly.     Vascular: No carotid bruit or JVD.  Cardiovascular:     Rate and Rhythm: Normal rate and regular rhythm.     Pulses: Normal pulses.     Heart sounds: Normal heart sounds.     No gallop.  Pulmonary:     Effort: Pulmonary effort is normal. No respiratory distress.     Breath sounds: Normal breath sounds. No wheezing.  Comments: Good air exch Chest:     Chest wall: No tenderness.  Abdominal:     General: Bowel sounds are normal. There is no distension or abdominal bruit.     Palpations: Abdomen is soft. There is no mass.     Tenderness: There is no abdominal tenderness.     Hernia: No hernia is present.  Genitourinary:    Comments: Breast exam: No mass, nodules, thickening, tenderness, bulging, retraction, inflamation, nipple discharge or skin changes noted.  No axillary or clavicular LA.     Breast implants noted Dense feeling tissue Musculoskeletal:        General: No tenderness. Normal range of motion.     Cervical back: Normal range of motion and neck supple. No rigidity. No muscular  tenderness.     Right lower leg: No edema.     Left lower leg: No edema.     Comments: No kyphosis   Lymphadenopathy:     Cervical: No cervical adenopathy.  Skin:    General: Skin is warm and dry.     Coloration: Skin is not pale.     Findings: No erythema or rash.     Comments: Solar lentigines diffusely Fair complexion  Neurological:     Mental Status: She is alert. Mental status is at baseline.     Cranial Nerves: No cranial nerve deficit.     Motor: No abnormal muscle tone.     Coordination: Coordination normal.     Gait: Gait normal.     Deep Tendon Reflexes: Reflexes are normal and symmetric. Reflexes normal.  Psychiatric:        Mood and Affect: Mood normal.        Cognition and Memory: Cognition and memory normal.           Assessment & Plan:   Problem List Items Addressed This Visit       Endocrine   Hypothyroidism    Hypothyroidism - stable Pt has no clinical changes No change in energy level/ hair or skin/ edema and no tremor Lab Results  Component Value Date   TSH 1.99 09/08/2022    Continue levothyroxine 100 mcg daily       Relevant Medications   levothyroxine (SYNTHROID) 100 MCG tablet     Nervous and Auditory   Hearing loss    Planning L cochlear implant in the next year         Musculoskeletal and Integument   Osteoporosis    Dexa utd 03/2022 On boniva since 2020 for 5 y course No falls or fx D level it therapeutic Encouraged her to start some strength training and keep walking         Genitourinary   TURNER'S SYNDROME    No new developments Had echo in 2017  No cardiac symptoms         Other   Obsessive-compulsive disorder    Stable and well controlled Continues fluoxetine 40 mg daily  Good self care       Routine general medical examination at a health care facility - Primary    Reviewed health habits including diet and exercise and skin cancer prevention Reviewed appropriate screening tests for age  Also reviewed  health mt list, fam hx and immunization status , as well as social and family history   See HPI Labs reviewed and ordered Declines shingrix/ flu and other vaccines Is allergic to tetanus vaccine Mammogram utd 03/2022 Pap udt 07/2018 normal with neg HPV Colonoscopy 11/2021 - ?  5 or 10 y recall dep on fam hx  Dexa 03/2022 stable  PHQ of 0

## 2022-09-15 NOTE — Assessment & Plan Note (Signed)
Dexa utd 03/2022 On boniva since 2020 for 5 y course No falls or fx D level it therapeutic Encouraged her to start some strength training and keep walking

## 2022-09-15 NOTE — Assessment & Plan Note (Signed)
Planning L cochlear implant in the next year

## 2022-09-15 NOTE — Assessment & Plan Note (Signed)
Hypothyroidism - stable Pt has no clinical changes No change in energy level/ hair or skin/ edema and no tremor Lab Results  Component Value Date   TSH 1.99 09/08/2022    Continue levothyroxine 100 mcg daily

## 2022-09-17 ENCOUNTER — Other Ambulatory Visit: Payer: Self-pay | Admitting: Family Medicine

## 2022-09-17 DIAGNOSIS — Z1231 Encounter for screening mammogram for malignant neoplasm of breast: Secondary | ICD-10-CM

## 2022-11-20 ENCOUNTER — Telehealth: Payer: Self-pay | Admitting: Family Medicine

## 2022-11-20 MED ORDER — TOLTERODINE TARTRATE ER 4 MG PO CP24: 4.0000 mg | ORAL_CAPSULE | Freq: Every day | ORAL | 0 refills | Status: DC

## 2022-11-20 NOTE — Telephone Encounter (Signed)
Patient called in and stated that her and Dr. Milinda Antis discussed some medication about an over active bladder. She stated that she would like to go ahead and get that medication sent in. Thank you!

## 2022-11-20 NOTE — Addendum Note (Signed)
Addended by: Roxy Manns A on: 11/20/2022 11:01 AM   Modules accepted: Orders

## 2022-11-20 NOTE — Telephone Encounter (Signed)
I sent some detrol to her [facility] to try  It can cause dry mouth and constipation or trouble urinating  If any intolerable side effects or if it does not help stop it   If any symptoms of uti let us know as well

## 2022-11-21 NOTE — Telephone Encounter (Signed)
Pt notified Rx sent to pharmacy and advised of Dr. Tower's comments  

## 2022-12-09 ENCOUNTER — Other Ambulatory Visit: Payer: Self-pay | Admitting: Family Medicine

## 2023-01-05 ENCOUNTER — Telehealth: Payer: Self-pay | Admitting: Family Medicine

## 2023-01-05 NOTE — Telephone Encounter (Signed)
Patient called in stating that medicationtolterodine (DETROL LA) 4 MG 24 hr capsule  Is costing her 80 dollars .However if the generic brand oxybutynin is called in her insurance will cover the full cost.  [facility] [place], [place] - [address] Phone: [phone]  Fax: [phone]

## 2023-01-06 MED ORDER — OXYBUTYNIN CHLORIDE ER 10 MG PO TB24: 10.0000 mg | ORAL_TABLET | Freq: Every day | ORAL | 1 refills | Status: DC

## 2023-01-06 NOTE — Telephone Encounter (Signed)
Pt notified Rx sent and advised of PCP's comments

## 2023-01-06 NOTE — Telephone Encounter (Signed)
I sent it - let me know how it works Debra Ford she tolerates

## 2023-03-22 ENCOUNTER — Other Ambulatory Visit: Payer: Self-pay | Admitting: Family Medicine

## 2023-04-13 ENCOUNTER — Encounter: Payer: Self-pay | Admitting: Nurse Practitioner

## 2023-04-13 ENCOUNTER — Ambulatory Visit (INDEPENDENT_AMBULATORY_CARE_PROVIDER_SITE_OTHER): Payer: Medicare Other | Admitting: Nurse Practitioner

## 2023-04-13 ENCOUNTER — Ambulatory Visit: Payer: Self-pay

## 2023-04-13 VITALS — BP 118/76 | HR 69 | Temp 98.0°F | Ht 59.5 in | Wt 149.0 lb

## 2023-04-13 DIAGNOSIS — N3090 Cystitis, unspecified without hematuria: Secondary | ICD-10-CM | POA: Diagnosis not present

## 2023-04-13 DIAGNOSIS — R300 Dysuria: Secondary | ICD-10-CM | POA: Diagnosis not present

## 2023-04-13 LAB — POCT URINALYSIS DIPSTICK
Bilirubin, UA: NEGATIVE
Blood, UA: POSITIVE
Glucose, UA: NEGATIVE
Ketones, UA: NEGATIVE
Nitrite, UA: NEGATIVE
Protein, UA: POSITIVE — AB
Spec Grav, UA: 1.015 (ref 1.010–1.025)
Urobilinogen, UA: 0.2 U/dL — AB
pH, UA: 6 (ref 5.0–8.0)

## 2023-04-13 MED ORDER — PHENAZOPYRIDINE HCL 100 MG PO TABS
100.0000 mg | ORAL_TABLET | Freq: Three times a day (TID) | ORAL | 0 refills | Status: DC | PRN
Start: 2023-04-13 — End: 2023-05-15

## 2023-04-13 MED ORDER — CEPHALEXIN 500 MG PO CAPS
500.0000 mg | ORAL_CAPSULE | Freq: Two times a day (BID) | ORAL | 0 refills | Status: DC
Start: 2023-04-13 — End: 2023-05-15

## 2023-04-13 NOTE — Patient Instructions (Signed)
Nice to see you today The one medication will cause the urine to be an orange/red color Follow up if you do not improve I have sent an antibiotic to the pharmacy

## 2023-04-13 NOTE — Assessment & Plan Note (Signed)
Treat with Keflex 500 mg twice daily for 7 days.  Pending urine culture.  Signs and symptoms reviewed when to be seen again

## 2023-04-13 NOTE — Progress Notes (Signed)
Acute Office Visit  Subjective:     Patient ID: Debra Ford, female    DOB: 1955/06/23, 67 y.o.   MRN: [id number]  Chief Complaint  Patient presents with   Hematuria    Symptoms started Saturday  Urinary frequency, stinging feeling while urinating. Today patient noticed she had blood in urine.      Patient is in today for dysuria with a history of skin cancer, thyroid disease, turners syndrome. Never a smoker    States that her sympotms started on Saturday. States that her bladder felt uncomforable. States that she is having urgency, frequency, States some heamturia today.  No history of kidney stones.  Review of Systems  Constitutional:  Negative for chills and fever.  Gastrointestinal:  Negative for abdominal pain, nausea and vomiting.  Genitourinary:  Positive for dysuria, frequency, hematuria and urgency.  Musculoskeletal:  Negative for back pain.        Objective:    BP 118/76   Pulse 69   Temp 98 F (36.7 C) (Oral)   Ht 4' 11.5" (1.511 m)   Wt 149 lb (67.6 kg)   SpO2 99%   BMI 29.59 kg/m    Physical Exam Vitals and nursing note reviewed.  Constitutional:      Appearance: Normal appearance.  Cardiovascular:     Rate and Rhythm: Normal rate and regular rhythm.     Heart sounds: Normal heart sounds.  Pulmonary:     Effort: Pulmonary effort is normal.     Breath sounds: Normal breath sounds.  Abdominal:     General: Bowel sounds are normal. There is no distension.     Palpations: There is no mass.     Tenderness: There is no abdominal tenderness. There is no right CVA tenderness or left CVA tenderness.     Hernia: No hernia is present.  Neurological:     Mental Status: She is alert.     Results for orders placed or performed in visit on 04/13/23  POCT urinalysis dipstick  Result Value Ref Range   Color, UA dark yellow    Clarity, UA cloudy    Glucose, UA Negative Negative   Bilirubin, UA neg    Ketones, UA neg    Spec Grav, UA 1.015 1.010 -  1.025   Blood, UA pos    pH, UA 6.0 5.0 - 8.0   Protein, UA Positive (A) Negative   Urobilinogen, UA 0.2 (A) 0.2 or 1.0 E.U./dL   Nitrite, UA neg    Leukocytes, UA Large (3+) (A) Negative   Appearance     Odor          Assessment & Plan:   Problem List Items Addressed This Visit       Genitourinary   Cystitis    Treat with Keflex 500 mg twice daily for 7 days.  Pending urine culture.  Signs and symptoms reviewed when to be seen again      Relevant Medications   cephALEXin (KEFLEX) 500 MG capsule   Other Relevant Orders   Urine Culture     Other   Dysuria - Primary    UA in office Pyridium 100 mg 3 times daily for the next 2 days did discuss that this will change urine red/orange color.      Relevant Medications   phenazopyridine (PYRIDIUM) 100 MG tablet   Other Relevant Orders   POCT urinalysis dipstick (Completed)    Meds ordered this encounter  Medications   cephALEXin (KEFLEX)  500 MG capsule    Sig: Take 1 capsule (500 mg total) by mouth 2 (two) times daily.    Dispense:  14 capsule    Refill:  0    Order Specific Question:   Supervising Provider    Answer:   Milinda Antis MARNE A [[id number]]   phenazopyridine (PYRIDIUM) 100 MG tablet    Sig: Take 1 tablet (100 mg total) by mouth 3 (three) times daily as needed for pain.    Dispense:  6 tablet    Refill:  0    Order Specific Question:   Supervising Provider    Answer:   TOWER, MARNE A [[id number]]    Return if symptoms worsen or fail to improve.  Audria Nine, NP

## 2023-04-13 NOTE — Assessment & Plan Note (Signed)
UA in office Pyridium 100 mg 3 times daily for the next 2 days did discuss that this will change urine red/orange color.

## 2023-04-15 LAB — URINE CULTURE
MICRO NUMBER:: 15796608
SPECIMEN QUALITY:: ADEQUATE

## 2023-04-16 ENCOUNTER — Telehealth: Payer: Self-pay | Admitting: *Deleted

## 2023-04-16 NOTE — Telephone Encounter (Signed)
-----   Message from [facility] sent at 04/13/2023  7:30 PM EST ----- I want to re check urinalysis (for protein) 2 weeks after finishing antibiotic please  Thanks ----- Message ----- From: Eden Emms, NP Sent: 04/13/2023   2:38 PM EST To: Judy Pimple, MD  FYI protein in urine

## 2023-04-16 NOTE — Telephone Encounter (Signed)
Left VM requesting pt to call the office back 

## 2023-04-20 ENCOUNTER — Other Ambulatory Visit: Payer: Self-pay | Admitting: Family Medicine

## 2023-04-20 NOTE — Telephone Encounter (Signed)
Last OV was for urinary issues on 04/13/23 with another provider. Last filled on 03/23/23 #30 tabs/ 0 refills

## 2023-04-20 NOTE — Telephone Encounter (Signed)
Pt was advised by front office

## 2023-04-27 ENCOUNTER — Encounter: Payer: Self-pay | Admitting: Family Medicine

## 2023-04-27 ENCOUNTER — Other Ambulatory Visit (INDEPENDENT_AMBULATORY_CARE_PROVIDER_SITE_OTHER): Payer: Medicare Other

## 2023-04-27 DIAGNOSIS — R8290 Unspecified abnormal findings in urine: Secondary | ICD-10-CM | POA: Diagnosis not present

## 2023-04-27 DIAGNOSIS — Z87440 Personal history of urinary (tract) infections: Secondary | ICD-10-CM | POA: Diagnosis not present

## 2023-04-27 DIAGNOSIS — N3090 Cystitis, unspecified without hematuria: Secondary | ICD-10-CM | POA: Diagnosis not present

## 2023-04-27 LAB — POCT UA - MICROSCOPIC ONLY

## 2023-04-27 LAB — POC URINALSYSI DIPSTICK (AUTOMATED)
Bilirubin, UA: NEGATIVE
Blood, UA: 200 — AB
Glucose, UA: NEGATIVE
Ketones, UA: NEGATIVE
Nitrite, UA: NEGATIVE
Protein, UA: POSITIVE — AB
Spec Grav, UA: 1.025 (ref 1.010–1.025)
Urobilinogen, UA: 0.2 U/dL
pH, UA: 6 (ref 5.0–8.0)

## 2023-04-27 NOTE — Addendum Note (Signed)
Addended by: Shon Millet on: 04/27/2023 10:37 AM   Modules accepted: Orders

## 2023-04-27 NOTE — Addendum Note (Signed)
Addended by: Shon Millet on: 04/27/2023 10:31 AM   Modules accepted: Orders

## 2023-04-27 NOTE — Addendum Note (Signed)
Addended by: Roxy Manns A on: 04/27/2023 11:00 AM   Modules accepted: Orders

## 2023-04-28 LAB — URINE CULTURE
MICRO NUMBER:: 15855056
SPECIMEN QUALITY:: ADEQUATE

## 2023-04-29 ENCOUNTER — Encounter: Payer: Self-pay | Admitting: Family Medicine

## 2023-04-29 MED ORDER — NITROFURANTOIN MONOHYD MACRO 100 MG PO CAPS: 100.0000 mg | ORAL_CAPSULE | Freq: Two times a day (BID) | ORAL | 0 refills | Status: DC

## 2023-05-15 ENCOUNTER — Ambulatory Visit (INDEPENDENT_AMBULATORY_CARE_PROVIDER_SITE_OTHER): Payer: Medicare Other | Admitting: Family Medicine

## 2023-05-15 ENCOUNTER — Encounter: Payer: Self-pay | Admitting: Family Medicine

## 2023-05-15 VITALS — BP 128/76 | HR 70 | Temp 98.0°F | Ht 59.5 in | Wt 147.2 lb

## 2023-05-15 DIAGNOSIS — N3090 Cystitis, unspecified without hematuria: Secondary | ICD-10-CM

## 2023-05-15 DIAGNOSIS — Z87440 Personal history of urinary (tract) infections: Secondary | ICD-10-CM | POA: Diagnosis not present

## 2023-05-15 LAB — POCT UA - MICROSCOPIC ONLY
Bacteria, U Microscopic: 0
Casts, Ur, LPF, POC: 0
Crystals, Ur, HPF, POC: 0
RBC, Urine, Miroscopic: 0 (ref 0–2)
Yeast, UA: 0

## 2023-05-15 LAB — POC URINALSYSI DIPSTICK (AUTOMATED)
Bilirubin, UA: NEGATIVE
Blood, UA: 25 — AB
Glucose, UA: NEGATIVE
Ketones, UA: NEGATIVE
Nitrite, UA: NEGATIVE
Protein, UA: NEGATIVE
Spec Grav, UA: 1.015 (ref 1.010–1.025)
Urobilinogen, UA: 0.2 U/dL
pH, UA: 6 (ref 5.0–8.0)

## 2023-05-15 NOTE — Patient Instructions (Signed)
 Urine is clear today  Keep up a good water intake   Always wipe front to back   If symptoms return or if you see blood in urine please call

## 2023-05-15 NOTE — Progress Notes (Signed)
 Subjective:    Patient ID: Debra Ford, female    DOB: May 28, 1955, 68 y.o.   MRN: [id number]  HPI  Wt Readings from Last 3 Encounters:  05/15/23 147 lb 4 oz (66.8 kg)  04/13/23 149 lb (67.6 kg)  09/15/22 146 lb (66.2 kg)   29.24 kg/m  Vitals:   05/15/23 0826  BP: 128/76  Pulse: 70  Temp: 98 F (36.7 C)  SpO2: 97%   Pt presents for follow up of cystitis  Was seen on 12/2 by NP Cable and dx with uti Urinalysis notable for large leuk and protein and rbc  Repeat on 12/16 still had leuk and rbc and protein     Treatment with keflex  500 mg bid Then macorbid   Culture noted e coli resistant to amp and I for augmentin  Susceptibility data from last 90 days. Collected Specimen Info Organism AMOXICILLIN /CLAVULANATE AMPICILLIN AMPICILLIN/SULBACTAM CEFAZOLIN CEFEPIME Ceftazidime CEFTRIAXONE Ciprofloxacin Gentamicin Susc lslt Imipenem LEVOFLOXACIN  04/13/23 Urine Escherichia coli  S  R  I  NR  S  S  S  S  S  S  S   Collected Specimen Info Organism Nitrofurantoin  Susc lslt Piperacillin + Tazobactam Tobramycin Susc lslt Trimethoprim/Sulfa  04/13/23 Urine Escherichia coli  S  S  S  S   Repeat culture 2 wk ago was clear   Now no symptoms at all  No more burning / blood / nausea or chills  Urine looks clear   Pt takes oxybutinin for bladder control Does not make it hard for it to empty   Some urgency worse since uti    No nsaids on med list-none at all   Dealing with some periodontal issues  Short roots on teeth / some bone loss Has to have extractions and a bridge   Today  Results for orders placed or performed in visit on 05/15/23  POCT Urinalysis Dipstick (Automated)   Collection Time: 05/15/23  8:45 AM  Result Value Ref Range   Color, UA Light Yellow    Clarity, UA Clear    Glucose, UA Negative Negative   Bilirubin, UA Negative    Ketones, UA Negative    Spec Grav, UA 1.015 1.010 - 1.025   Blood, UA 25 Ery/uL (A)    pH, UA 6.0 5.0 - 8.0   Protein, UA  Negative Negative   Urobilinogen, UA 0.2 0.2 or 1.0 E.U./dL   Nitrite, UA Negative    Leukocytes, UA Small (1+) (A) Negative  POCT UA - Microscopic Only   Collection Time: 05/15/23  8:53 AM  Result Value Ref Range   WBC, Ur, HPF, POC 0-1 0 - 5   RBC, Urine, Miroscopic 0 0 - 2   Bacteria, U Microscopic 0 None - Trace   Mucus, UA few    Epithelial cells, urine per micros few    Crystals, Ur, HPF, POC 0    Casts, Ur, LPF, POC 0    Yeast, UA 0      Had some hemorrhoids  Not bleeding today   Asymptomatic today      Patient Active Problem List   Diagnosis Date Noted   Dysuria 04/13/2023   Cystitis 04/13/2023   Intertrigo 09/23/2021   Elevated liver enzymes 07/18/2018   Pre-operative examination 01/13/2017   Breast implant leak 08/06/2015   Encounter for routine gynecological examination 07/04/2015   Estrogen deficiency 07/04/2015   Routine general medical examination at a health care facility 06/25/2014   Family history of colon  cancer 04/21/2011   Osteoporosis 12/29/2006   Hypothyroidism 11/30/2006   Obsessive-compulsive disorder 11/20/2006   Hearing loss 11/20/2006   Internal hemorrhoids 11/20/2006   TURNER'S SYNDROME 11/20/2006   Past Medical History:  Diagnosis Date   Allergy    seasonal   Cancer (HCC)    skin cancer on nose   Cataract    removed both eyes   Elevated liver function tests    Hearing loss    Hyperthyroidism    OCD (obsessive compulsive disorder)    Osteopenia    PONV (postoperative nausea and vomiting)    1988 with breast implants   Turner's syndrome    Past Surgical History:  Procedure Laterality Date   AUGMENTATION MAMMAPLASTY     BREAST ENHANCEMENT SURGERY  1988   bilateral   COCHLEAR IMPLANT  2018   COCHLEAR IMPLANT Left 2024   COLONOSCOPY     2013   DILATION AND CURETTAGE OF UTERUS     maxum  2012   hearing surgery   POLYPECTOMY     Social History   Tobacco Use   Smoking status: Never   Smokeless tobacco: Never   Substance Use Topics   Alcohol use: No    Alcohol/week: 0.0 standard drinks of alcohol   Drug use: No   Family History  Problem Relation Age of Onset   Colon cancer Mother 58   Diabetes Father    Colon polyps Father 32   Heart disease Father    Prostate cancer Father    Lung cancer Father    Cancer - Lung Father    Alcoholism Brother    Heart attack Brother    Diabetes Maternal Grandmother    Diabetes Paternal Grandmother    Diabetes Paternal Grandfather    Stomach cancer Neg Hx    Esophageal cancer Neg Hx    Rectal cancer Neg Hx    Breast cancer Neg Hx    Allergies  Allergen Reactions   Tetanus Toxoids     Hives (?)   Current Outpatient Medications on File Prior to Visit  Medication Sig Dispense Refill   ALPRAZolam  (XANAX ) 0.5 MG tablet TAKE 1/2 TO 1 (ONE-HALF TO ONE) TABLET BY MOUTH ONCE DAILY AS NEEDED FOR ANXIETY 15 tablet 0   Ascorbic Acid (VITAMIN C) 1000 MG tablet Take 1,000 mg by mouth daily.     CALCIUM CITRATE-VITAMIN D  PO Take 2 tablets by mouth daily.     fexofenadine (ALLEGRA) 180 MG tablet Take 180 mg by mouth daily.     FLUoxetine  (PROZAC ) 40 MG capsule Take 1 capsule (40 mg total) by mouth daily. 90 capsule 3   fluticasone  (FLONASE ) 50 MCG/ACT nasal spray Place into both nostrils daily.     folic acid (FOLVITE) 1 MG tablet Take by mouth.     ibandronate  (BONIVA ) 150 MG tablet Take 1 tablet (150 mg total) by mouth every 30 (thirty) days. Take in the morning with a full glass of water, on an empty stomach, and do not take anything else by mouth or lie down for the next 30 min. 3 tablet 3   levothyroxine  (SYNTHROID ) 100 MCG tablet Take 1 tablet (100 mcg total) by mouth daily. 90 tablet 3   Multiple Vitamin (MULTIVITAMIN) tablet Take 1 tablet by mouth daily.     oxybutynin  (DITROPAN -XL) 10 MG 24 hr tablet TAKE 1 TABLET BY MOUTH AT BEDTIME 30 tablet 1   Vitamin D , Ergocalciferol , 2000 units CAPS Take 1 capsule by mouth daily.  VITAMIN E PO Take by mouth.      No current facility-administered medications on file prior to visit.    Review of Systems  Constitutional:  Negative for activity change, appetite change, fatigue, fever and unexpected weight change.  HENT:  Positive for hearing loss. Negative for congestion, ear pain, rhinorrhea, sinus pressure and sore throat.   Eyes:  Negative for pain, redness and visual disturbance.  Respiratory:  Negative for cough, shortness of breath and wheezing.   Cardiovascular:  Negative for chest pain and palpitations.  Gastrointestinal:  Negative for abdominal pain, blood in stool, constipation and diarrhea.  Endocrine: Negative for polydipsia and polyuria.  Genitourinary:  Negative for dysuria, frequency and urgency.  Musculoskeletal:  Negative for arthralgias, back pain and myalgias.  Skin:  Negative for pallor and rash.  Allergic/Immunologic: Negative for environmental allergies.  Neurological:  Negative for dizziness, syncope and headaches.  Hematological:  Negative for adenopathy. Does not bruise/bleed easily.  Psychiatric/Behavioral:  Negative for decreased concentration and dysphoric mood. The patient is not nervous/anxious.        Objective:   Physical Exam Constitutional:      General: She is not in acute distress.    Appearance: Normal appearance. She is well-developed.     Comments: Overweight   HENT:     Head: Normocephalic and atraumatic.  Eyes:     Conjunctiva/sclera: Conjunctivae normal.     Pupils: Pupils are equal, round, and reactive to light.  Neck:     Thyroid : No thyromegaly.     Vascular: No carotid bruit or JVD.  Cardiovascular:     Rate and Rhythm: Normal rate and regular rhythm.     Heart sounds: Normal heart sounds.     No gallop.  Pulmonary:     Effort: Pulmonary effort is normal. No respiratory distress.     Breath sounds: Normal breath sounds. No wheezing or rales.  Abdominal:     General: There is no distension or abdominal bruit.     Palpations: Abdomen is  soft.     Tenderness: There is no right CVA tenderness or left CVA tenderness.     Comments: No suprapubic tenderness or fullness    Musculoskeletal:     Cervical back: Normal range of motion and neck supple.     Right lower leg: No edema.     Left lower leg: No edema.  Lymphadenopathy:     Cervical: No cervical adenopathy.  Skin:    General: Skin is warm and dry.     Coloration: Skin is not pale.     Findings: No rash.  Neurological:     Mental Status: She is alert.     Coordination: Coordination normal.     Deep Tendon Reflexes: Reflexes are normal and symmetric. Reflexes normal.  Psychiatric:        Mood and Affect: Mood normal.           Assessment & Plan:   Problem List Items Addressed This Visit       Genitourinary   Cystitis   Resolved clinically and U micro is entirely clear after last treatment with macrobid    Encouraged good water intake Discussed ways to prevent uti   Instructed to call if any symptoms return       Relevant Orders   POCT UA - Microscopic Only (Completed)   Other Visit Diagnoses       History of UTI    -  Primary   Relevant Orders  POCT Urinalysis Dipstick (Automated) (Completed)   POCT UA - Microscopic Only (Completed)

## 2023-05-15 NOTE — Assessment & Plan Note (Signed)
 Resolved clinically and U micro is entirely clear after last treatment with macrobid   Encouraged good water intake Discussed ways to prevent uti   Instructed to call if any symptoms return

## 2023-06-19 ENCOUNTER — Other Ambulatory Visit: Payer: Self-pay | Admitting: Family Medicine

## 2023-06-22 NOTE — Telephone Encounter (Signed)
 Last filled on 04/20/23 #30 tabs/ 1 refill  Last OV was for urinary issues on 05/15/23

## 2023-07-12 ENCOUNTER — Telehealth: Admitting: Physician Assistant

## 2023-07-12 DIAGNOSIS — J209 Acute bronchitis, unspecified: Secondary | ICD-10-CM

## 2023-07-12 MED ORDER — PREDNISONE 20 MG PO TABS: 40.0000 mg | ORAL_TABLET | Freq: Every day | ORAL | 0 refills | Status: AC

## 2023-07-12 MED ORDER — BENZONATATE 100 MG PO CAPS: ORAL_CAPSULE | ORAL | 0 refills | Status: DC

## 2023-07-12 NOTE — Progress Notes (Signed)
 Virtual Visit Consent   [name], you are scheduled for a virtual visit with a [facility] provider today. Just as with appointments in the office, your consent must be obtained to participate. Your consent will be active for this visit and any virtual visit you may have with one of our providers in the next 365 days. If you have a MyChart account, a copy of this consent can be sent to you electronically.  As this is a virtual visit, video technology does not allow for your provider to perform a traditional examination. This may limit your provider's ability to fully assess your condition. If your provider identifies any concerns that need to be evaluated in person or the need to arrange testing (such as labs, EKG, etc.), we will make arrangements to do so. Although advances in technology are sophisticated, we cannot ensure that it will always work on either your end or our end. If the connection with a video visit is poor, the visit may have to be switched to a telephone visit. With either a video or telephone visit, we are not always able to ensure that we have a secure connection.  By engaging in this virtual visit, you consent to the provision of healthcare and authorize for your insurance to be billed (if applicable) for the services provided during this visit. Depending on your insurance coverage, you may receive a charge related to this service.  I need to obtain your verbal consent now. Are you willing to proceed with your visit today? Debra Ford has provided verbal consent on 07/12/2023 for a virtual visit (video or telephone). [name], PA-C  Date: 07/12/2023 1:14 PM   Virtual Visit via Video Note   I, [name], connected with  Debra Ford  ([id number], March 22, 1956) on 07/12/23 at  1:15 PM EST by a video-enabled telemedicine application and verified that I am speaking with the correct person using two identifiers.  Location: Patient: Virtual Visit Location Patient:  Home Provider: Virtual Visit Location Provider: Home Office   I discussed the limitations of evaluation and management by telemedicine and the availability of in person appointments. The patient expressed understanding and agreed to proceed.    History of Present Illness: Debra Ford is a 68 y.o. who identifies as a female who was assigned female at birth,  presents with a two-week history of a persistent cough, nasal congestion, and sinus drainage. The cough is described as 'irritating,' particularly at night, and is productive of 'greenish brown' sputum. The patient also reports nasal congestion and sinus drainage. She denies recent exposure to sick contacts.  To manage these symptoms, the patient has been taking Nyquil, vitamin C, and Flonase (generic). Despite these interventions, the patient reports persistent symptoms and feeling 'yucky.'    Problems:  Patient Active Problem List   Diagnosis Date Noted   Dysuria 04/13/2023   Cystitis 04/13/2023   Intertrigo 09/23/2021   Elevated liver enzymes 07/18/2018   Pre-operative examination 01/13/2017   Breast implant leak 08/06/2015   Encounter for routine gynecological examination 07/04/2015   Estrogen deficiency 07/04/2015   Routine general medical examination at a health care facility 06/25/2014   Family history of colon cancer 04/21/2011   Osteoporosis 12/29/2006   Hypothyroidism 11/30/2006   Obsessive-compulsive disorder 11/20/2006   Hearing loss 11/20/2006   Internal hemorrhoids 11/20/2006   TURNER'S SYNDROME 11/20/2006    Allergies:  Allergies  Allergen Reactions   Tetanus Toxoids     Hives (?)  Medications:  Current Outpatient Medications:    benzonatate (TESSALON) 100 MG capsule, Take 1-2 caps PO TID PRN, Disp: 20 capsule, Rfl: 0   predniSONE (DELTASONE) 20 MG tablet, Take 2 tablets (40 mg total) by mouth daily with breakfast for 5 days., Disp: 10 tablet, Rfl: 0   ALPRAZolam (XANAX) 0.5 MG tablet, TAKE 1/2 TO 1  (ONE-HALF TO ONE) TABLET BY MOUTH ONCE DAILY AS NEEDED FOR ANXIETY, Disp: 15 tablet, Rfl: 0   Ascorbic Acid (VITAMIN C) 1000 MG tablet, Take 1,000 mg by mouth daily., Disp: , Rfl:    CALCIUM CITRATE-VITAMIN D PO, Take 2 tablets by mouth daily., Disp: , Rfl:    fexofenadine (ALLEGRA) 180 MG tablet, Take 180 mg by mouth daily., Disp: , Rfl:    FLUoxetine (PROZAC) 40 MG capsule, Take 1 capsule (40 mg total) by mouth daily., Disp: 90 capsule, Rfl: 3   fluticasone (FLONASE) 50 MCG/ACT nasal spray, Place into both nostrils daily., Disp: , Rfl:    folic acid (FOLVITE) 1 MG tablet, Take by mouth., Disp: , Rfl:    ibandronate (BONIVA) 150 MG tablet, Take 1 tablet (150 mg total) by mouth every 30 (thirty) days. Take in the morning with a full glass of water, on an empty stomach, and do not take anything else by mouth or lie down for the next 30 min., Disp: 3 tablet, Rfl: 3   levothyroxine (SYNTHROID) 100 MCG tablet, Take 1 tablet (100 mcg total) by mouth daily., Disp: 90 tablet, Rfl: 3   Multiple Vitamin (MULTIVITAMIN) tablet, Take 1 tablet by mouth daily., Disp: , Rfl:    oxybutynin (DITROPAN-XL) 10 MG 24 hr tablet, TAKE 1 TABLET BY MOUTH AT BEDTIME, Disp: 90 tablet, Rfl: 2   Vitamin D, Ergocalciferol, 2000 units CAPS, Take 1 capsule by mouth daily., Disp: , Rfl:    VITAMIN E PO, Take by mouth., Disp: , Rfl:   Observations/Objective: Patient is well-developed, well-nourished in no acute distress.  Resting comfortably  at home.  Head is normocephalic, atraumatic.  No labored breathing.  Speech is clear and coherent with logical content.  Patient is alert and oriented at baseline.    Assessment and Plan: 1. Acute bronchitis, unspecified organism (Primary) - benzonatate (TESSALON) 100 MG capsule; Take 1-2 caps PO TID PRN  Dispense: 20 capsule; Refill: 0 - predniSONE (DELTASONE) 20 MG tablet; Take 2 tablets (40 mg total) by mouth daily with breakfast for 5 days.  Dispense: 10 tablet; Refill:  0  Symptoms consistent with viral acute bronchitis. Antibiotics not indicated.   Follow Up Instructions: I discussed the assessment and treatment plan with the patient. The patient was provided an opportunity to ask questions and all were answered. The patient agreed with the plan and demonstrated an understanding of the instructions.  A copy of instructions were sent to the patient via MyChart unless otherwise noted below.     The patient was advised to call back or seek an in-person evaluation if the symptoms worsen or if the condition fails to improve as anticipated.    Kasandra Knudsen Mayers, PA-C

## 2023-07-12 NOTE — Patient Instructions (Signed)
 Debra Ford, thank you for joining [name], PA-C for today's virtual visit.  While this provider is not your primary care provider (PCP), if your PCP is located in our provider database this encounter information will be shared with them immediately following your visit.   A [facility] MyChart account gives you access to today's visit and all your visits, tests, and labs performed at [facility] " click here if you don't have a [facility] MyChart account or go to [email or web address]  Consent: (Patient) Debra Ford provided verbal consent for this virtual visit at the beginning of the encounter.  Current Medications:  Current Outpatient Medications:    benzonatate (TESSALON) 100 MG capsule, Take 1-2 caps PO TID PRN, Disp: 20 capsule, Rfl: 0   predniSONE (DELTASONE) 20 MG tablet, Take 2 tablets (40 mg total) by mouth daily with breakfast for 5 days., Disp: 10 tablet, Rfl: 0   ALPRAZolam (XANAX) 0.5 MG tablet, TAKE 1/2 TO 1 (ONE-HALF TO ONE) TABLET BY MOUTH ONCE DAILY AS NEEDED FOR ANXIETY, Disp: 15 tablet, Rfl: 0   Ascorbic Acid (VITAMIN C) 1000 MG tablet, Take 1,000 mg by mouth daily., Disp: , Rfl:    CALCIUM CITRATE-VITAMIN D PO, Take 2 tablets by mouth daily., Disp: , Rfl:    fexofenadine (ALLEGRA) 180 MG tablet, Take 180 mg by mouth daily., Disp: , Rfl:    FLUoxetine (PROZAC) 40 MG capsule, Take 1 capsule (40 mg total) by mouth daily., Disp: 90 capsule, Rfl: 3   fluticasone (FLONASE) 50 MCG/ACT nasal spray, Place into both nostrils daily., Disp: , Rfl:    folic acid (FOLVITE) 1 MG tablet, Take by mouth., Disp: , Rfl:    ibandronate (BONIVA) 150 MG tablet, Take 1 tablet (150 mg total) by mouth every 30 (thirty) days. Take in the morning with a full glass of water, on an empty stomach, and do not take anything else by mouth or lie down for the next 30 min., Disp: 3 tablet, Rfl: 3   levothyroxine (SYNTHROID) 100 MCG tablet, Take 1 tablet (100 mcg total) by mouth  daily., Disp: 90 tablet, Rfl: 3   Multiple Vitamin (MULTIVITAMIN) tablet, Take 1 tablet by mouth daily., Disp: , Rfl:    oxybutynin (DITROPAN-XL) 10 MG 24 hr tablet, TAKE 1 TABLET BY MOUTH AT BEDTIME, Disp: 90 tablet, Rfl: 2   Vitamin D, Ergocalciferol, 2000 units CAPS, Take 1 capsule by mouth daily., Disp: , Rfl:    VITAMIN E PO, Take by mouth., Disp: , Rfl:    Medications ordered in this encounter:  Meds ordered this encounter  Medications   benzonatate (TESSALON) 100 MG capsule    Sig: Take 1-2 caps PO TID PRN    Dispense:  20 capsule    Refill:  0    Supervising Provider:   Merrilee Jansky [[id number]]   predniSONE (DELTASONE) 20 MG tablet    Sig: Take 2 tablets (40 mg total) by mouth daily with breakfast for 5 days.    Dispense:  10 tablet    Refill:  0    Supervising Provider:   Merrilee Jansky [[id number]]     *If you need refills on other medications prior to your next appointment, please contact your pharmacy*  Follow-Up: Call back or seek an in-person evaluation if the symptoms worsen or if the condition fails to improve as anticipated.  [facility] [phone]  Other Instructions Acute Bronchitis, Adult  Acute bronchitis is sudden inflammation of  the main airways (bronchi) that come off the windpipe (trachea) in the lungs. The swelling causes the airways to get smaller and make more mucus than normal. This can make it hard to breathe and can cause coughing or noisy breathing (wheezing). Acute bronchitis may last several weeks. The cough may last longer. Allergies, asthma, and exposure to smoke may make the condition worse. What are the causes? This condition can be caused by germs and by substances that irritate the lungs, including: Cold and flu viruses. The most common cause of this condition is the virus that causes the common cold. Bacteria. This is less common. Breathing in substances that irritate the lungs, including: Smoke from cigarettes and  other forms of tobacco. Dust and pollen. Fumes from household cleaning products, gases, or burned fuel. Indoor or outdoor air pollution. What increases the risk? The following factors may make you more likely to develop this condition: A weak body's defense system, also called the immune system. A condition that affects your lungs and breathing, such as asthma. What are the signs or symptoms? Common symptoms of this condition include: Coughing. This may bring up clear, yellow, or green mucus from your lungs (sputum). Wheezing. Runny or stuffy nose. Having too much mucus in your lungs (chest congestion). Shortness of breath. Aches and pains, including sore throat or chest. How is this diagnosed? This condition is usually diagnosed based on: Your symptoms and medical history. A physical exam. You may also have other tests, including tests to rule out other conditions, such as pneumonia. These tests include: A test of lung function. Test of a mucus sample to look for the presence of bacteria. Tests to check the oxygen level in your blood. Blood tests. Chest X-ray. How is this treated? Most cases of acute bronchitis clear up over time without treatment. Your health care provider may recommend: Drinking more fluids to help thin your mucus so it is easier to cough up. Taking inhaled medicine (inhaler) to improve air flow in and out of your lungs. Using a vaporizer or a humidifier. These are machines that add water to the air to help you breathe better. Taking a medicine that thins mucus and clears congestion (expectorant). Taking a medicine that prevents or stops coughing (cough suppressant). It is not common to take an antibiotic medicine for this condition. Follow these instructions at home:  Take over-the-counter and prescription medicines only as told by your health care provider. Use an inhaler, vaporizer, or humidifier as told by your health care provider. Take two teaspoons (10  mL) of honey at bedtime to lessen coughing at night. Drink enough fluid to keep your urine pale yellow. Do not use any products that contain nicotine or tobacco. These products include cigarettes, chewing tobacco, and vaping devices, such as e-cigarettes. If you need help quitting, ask your health care provider. Get plenty of rest. Return to your normal activities as told by your health care provider. Ask your health care provider what activities are safe for you. Keep all follow-up visits. This is important. How is this prevented? To lower your risk of getting this condition again: Wash your hands often with soap and water for at least 20 seconds. If soap and water are not available, use hand sanitizer. Avoid contact with people who have cold symptoms. Try not to touch your mouth, nose, or eyes with your hands. Avoid breathing in smoke or chemical fumes. Breathing smoke or chemical fumes will make your condition worse. Get the flu shot every year.  Contact a health care provider if: Your symptoms do not improve after 2 weeks. You have trouble coughing up the mucus. Your cough keeps you awake at night. You have a fever. Get help right away if you: Cough up blood. Feel pain in your chest. Have severe shortness of breath. Faint or keep feeling like you are going to faint. Have a severe headache. Have a fever or chills that get worse. These symptoms may represent a serious problem that is an emergency. Do not wait to see if the symptoms will go away. Get medical help right away. Call your local emergency services (911 in the [place] Do not drive yourself to the hospital. Summary Acute bronchitis is inflammation of the main airways (bronchi) that come off the windpipe (trachea) in the lungs. The swelling causes the airways to get smaller and make more mucus than normal. Drinking more fluids can help thin your mucus so it is easier to cough up. Take over-the-counter and prescription medicines  only as told by your health care provider. Do not use any products that contain nicotine or tobacco. These products include cigarettes, chewing tobacco, and vaping devices, such as e-cigarettes. If you need help quitting, ask your health care provider. Contact a health care provider if your symptoms do not improve after 2 weeks. This information is not intended to replace advice given to you by your health care provider. Make sure you discuss any questions you have with your health care provider. Document Revised: 08/08/2021 Document Reviewed: 08/29/2020 Elsevier Patient Education  2024 Elsevier Inc.   If you have been instructed to have an in-person evaluation today at a local Urgent Care facility, please use the link below. It will take you to a list of all of our available [facility], including address, phone number and hours of operation. Please do not delay care.  [facility]  If you or a family member do not have a primary care provider, use the link below to schedule a visit and establish care. When you choose a [facility] primary care physician or advanced practice provider, you gain a long-term partner in health. Find a Primary Care Provider  Learn more about [facility] in-office and virtual care options: [facility] Get Care Now

## 2023-07-13 ENCOUNTER — Other Ambulatory Visit: Payer: Self-pay | Admitting: Family Medicine

## 2023-07-14 ENCOUNTER — Ambulatory Visit
Admission: RE | Admit: 2023-07-14 | Discharge: 2023-07-14 | Disposition: A | Discharge status: Home / Self Care | Payer: Medicare Other | Source: Ambulatory Visit | Attending: Family Medicine | Admitting: Family Medicine

## 2023-07-14 DIAGNOSIS — Z1231 Encounter for screening mammogram for malignant neoplasm of breast: Secondary | ICD-10-CM

## 2023-07-19 ENCOUNTER — Encounter: Payer: Self-pay | Admitting: Family Medicine

## 2023-08-03 ENCOUNTER — Ambulatory Visit (INDEPENDENT_AMBULATORY_CARE_PROVIDER_SITE_OTHER): Admitting: Family Medicine

## 2023-08-03 ENCOUNTER — Ambulatory Visit (INDEPENDENT_AMBULATORY_CARE_PROVIDER_SITE_OTHER)
Admission: RE | Admit: 2023-08-03 | Discharge: 2023-08-03 | Disposition: A | Discharge status: Home / Self Care | Source: Ambulatory Visit | Attending: Family Medicine | Admitting: Family Medicine

## 2023-08-03 ENCOUNTER — Encounter: Payer: Self-pay | Admitting: Family Medicine

## 2023-08-03 VITALS — BP 128/70 | HR 75 | Temp 98.0°F | Ht 59.5 in | Wt 143.5 lb

## 2023-08-03 DIAGNOSIS — J209 Acute bronchitis, unspecified: Secondary | ICD-10-CM | POA: Insufficient documentation

## 2023-08-03 DIAGNOSIS — J301 Allergic rhinitis due to pollen: Secondary | ICD-10-CM | POA: Diagnosis not present

## 2023-08-03 DIAGNOSIS — J309 Allergic rhinitis, unspecified: Secondary | ICD-10-CM | POA: Insufficient documentation

## 2023-08-03 MED ORDER — PREDNISONE 20 MG PO TABS: 20.0000 mg | ORAL_TABLET | Freq: Every day | ORAL | 0 refills | Status: DC

## 2023-08-03 MED ORDER — BENZONATATE 200 MG PO CAPS: 200.0000 mg | ORAL_CAPSULE | Freq: Three times a day (TID) | ORAL | 1 refills | Status: DC | PRN

## 2023-08-03 NOTE — Progress Notes (Signed)
 Subjective:    Patient ID: Debra Ford, female    DOB: 10/11/1955, 67 y.o.   MRN: [id number]  HPI  Wt Readings from Last 3 Encounters:  08/03/23 143 lb 8 oz (65.1 kg)  05/15/23 147 lb 4 oz (66.8 kg)  04/13/23 149 lb (67.6 kg)   28.50 kg/m  Vitals:   08/03/23 0756 08/03/23 0815  BP: (!) 146/70 128/70  Pulse: 75   Temp: 98 F (36.7 C)   SpO2: 99%     Pt presents with c/o cough and runny nose for a month    Had acute bronchitis / virtual visit on 07/12/23  Prescription prednisone and tessalon  Took prednisone one day and stopped  Tessalon helped the cough  Never tested for covid   Husband got sick also   Now Off and on cough- hard cough  Little phlegm - ? Color  No wheezing   Tired  No fever  Runny nose -clear  Some congestion  Some headache  Some facial pain  Ears are ok  Throat is ok   Eyes are itchy  Feels like allergies    Allegra Flonase -did increase for allergy season  Mucinex dm Nyquil dm  Vit c  Cxr today DG Chest 2 View Result Date: 08/03/2023 CLINICAL DATA:  cough for a month after viral bronchitis , also allergies. EXAM: CHEST - 2 VIEW COMPARISON:  01/13/2017. FINDINGS: Bilateral lung fields are clear. Bilateral costophrenic angles are clear. Normal cardio-mediastinal silhouette. No acute osseous abnormalities. Apparent opacity overlying the right mid lower lung zone laterally corresponds to breast implant. The soft tissues are otherwise within normal limits. IMPRESSION: *No active cardiopulmonary disease. Electronically Signed   By: Jules Schick M.D.   On: 08/03/2023 08:48   MM 3D SCREENING MAMMOGRAM BILATERAL BREAST W/IMPLANT Result Date: 07/17/2023 CLINICAL DATA:  Screening. EXAM: DIGITAL SCREENING BILATERAL MAMMOGRAM WITH IMPLANTS, CAD AND TOMOSYNTHESIS TECHNIQUE: Bilateral screening digital craniocaudal and mediolateral oblique mammograms were obtained. Bilateral screening digital breast tomosynthesis was performed. The images were  evaluated with computer-aided detection. Standard and/or implant displaced views were performed. COMPARISON:  Previous exam(s). ACR Breast Density Category d: The breasts are extremely dense, which lowers the sensitivity of mammography. FINDINGS: The patient has retropectoral implants. There is bilateral extracapsular silicone compatible with extracapsular silicone rupture. There are no findings suspicious for malignancy. IMPRESSION: 1. No mammographic evidence of malignancy. A result letter of this screening mammogram will be mailed directly to the patient. 2.  Bilateral silicone implant rupture. RECOMMENDATION: 1.  Screening mammogram in one year. (Code:[id number]-B-01Y) 2. Consultation with plastic surgery if management of implant rupture is desired. BI-RADS CATEGORY  1: Negative. Electronically Signed   By: Frederico Hamman M.D.   On: 07/17/2023 07:24      Patient Active Problem List   Diagnosis Date Noted   Acute bronchitis 08/03/2023   Allergic rhinitis 08/03/2023   Dysuria 04/13/2023   Cystitis 04/13/2023   Intertrigo 09/23/2021   Elevated liver enzymes 07/18/2018   Pre-operative examination 01/13/2017   Breast implant leak 08/06/2015   Encounter for routine gynecological examination 07/04/2015   Estrogen deficiency 07/04/2015   Routine general medical examination at a health care facility 06/25/2014   Family history of colon cancer 04/21/2011   Osteoporosis 12/29/2006   Hypothyroidism 11/30/2006   Obsessive-compulsive disorder 11/20/2006   Hearing loss 11/20/2006   Internal hemorrhoids 11/20/2006   TURNER'S SYNDROME 11/20/2006   Past Medical History:  Diagnosis Date   Allergy    seasonal  Cancer (HCC)    skin cancer on nose   Cataract    removed both eyes   Elevated liver function tests    Hearing loss    Hyperthyroidism    OCD (obsessive compulsive disorder)    Osteopenia    PONV (postoperative nausea and vomiting)    1988 with breast implants   Turner's syndrome    Past  Surgical History:  Procedure Laterality Date   AUGMENTATION MAMMAPLASTY     BREAST ENHANCEMENT SURGERY  1988   bilateral   COCHLEAR IMPLANT  2018   COCHLEAR IMPLANT Left 2024   COLONOSCOPY     2013   DILATION AND CURETTAGE OF UTERUS     maxum  2012   hearing surgery   POLYPECTOMY     Social History   Tobacco Use   Smoking status: Never   Smokeless tobacco: Never  Substance Use Topics   Alcohol use: No    Alcohol/week: 0.0 standard drinks of alcohol   Drug use: No   Family History  Problem Relation Age of Onset   Colon cancer Mother 54   Diabetes Father    Colon polyps Father 67   Heart disease Father    Prostate cancer Father    Lung cancer Father    Cancer - Lung Father    Alcoholism Brother    Heart attack Brother    Diabetes Maternal Grandmother    Diabetes Paternal Grandmother    Diabetes Paternal Grandfather    Stomach cancer Neg Hx    Esophageal cancer Neg Hx    Rectal cancer Neg Hx    Breast cancer Neg Hx    Allergies  Allergen Reactions   Tetanus Toxoids     Hives (?)   Current Outpatient Medications on File Prior to Visit  Medication Sig Dispense Refill   ALPRAZolam (XANAX) 0.5 MG tablet TAKE 1/2 TO 1 (ONE-HALF TO ONE) TABLET BY MOUTH ONCE DAILY AS NEEDED FOR ANXIETY 15 tablet 0   Ascorbic Acid (VITAMIN C) 1000 MG tablet Take 1,000 mg by mouth daily.     CALCIUM CITRATE-VITAMIN D PO Take 2 tablets by mouth daily.     fexofenadine (ALLEGRA) 180 MG tablet Take 180 mg by mouth daily.     FLUoxetine (PROZAC) 40 MG capsule Take 1 capsule (40 mg total) by mouth daily. 90 capsule 3   fluticasone (FLONASE) 50 MCG/ACT nasal spray Place into both nostrils daily.     folic acid (FOLVITE) 1 MG tablet Take by mouth.     ibandronate (BONIVA) 150 MG tablet Take 1 tablet (150 mg total) by mouth every 30 (thirty) days. Take in the morning with a full glass of water, on an empty stomach, and do not take anything else by mouth or lie down for the next 30 min. 3 tablet 3    levothyroxine (SYNTHROID) 100 MCG tablet Take 1 tablet (100 mcg total) by mouth daily. 90 tablet 3   Multiple Vitamin (MULTIVITAMIN) tablet Take 1 tablet by mouth daily.     oxybutynin (DITROPAN-XL) 10 MG 24 hr tablet TAKE 1 TABLET BY MOUTH AT BEDTIME 90 tablet 2   Vitamin D, Ergocalciferol, 2000 units CAPS Take 1 capsule by mouth daily.     VITAMIN E PO Take by mouth.     No current facility-administered medications on file prior to visit.    Review of Systems  Constitutional:  Positive for appetite change and fatigue. Negative for fever.  HENT:  Positive for congestion, postnasal  drip, rhinorrhea, sinus pressure and sneezing. Negative for ear pain and sore throat.   Eyes:  Negative for pain and discharge.  Respiratory:  Positive for cough. Negative for shortness of breath, wheezing and stridor.   Cardiovascular:  Negative for chest pain.  Gastrointestinal:  Negative for diarrhea, nausea and vomiting.  Genitourinary:  Negative for frequency, hematuria and urgency.  Musculoskeletal:  Negative for arthralgias and myalgias.  Skin:  Negative for rash.  Neurological:  Positive for headaches. Negative for dizziness, weakness and light-headedness.       No headache or sinus pain today  Psychiatric/Behavioral:  Negative for confusion and dysphoric mood.        Objective:   Physical Exam Constitutional:      General: She is not in acute distress.    Appearance: Normal appearance. She is well-developed and normal weight. She is not ill-appearing, toxic-appearing or diaphoretic.  HENT:     Head: Normocephalic and atraumatic.     Comments: Nares are injected and congested      Right Ear: Tympanic membrane, ear canal and external ear normal.     Left Ear: Tympanic membrane, ear canal and external ear normal.     Nose: Rhinorrhea present. No congestion.     Comments: Boggy nares   No sinus tenderness    Mouth/Throat:     Mouth: Mucous membranes are moist.     Pharynx: Oropharynx is  clear. No oropharyngeal exudate or posterior oropharyngeal erythema.     Comments: Clear pnd  Eyes:     General:        Right eye: No discharge.        Left eye: No discharge.     Conjunctiva/sclera: Conjunctivae normal.     Pupils: Pupils are equal, round, and reactive to light.  Cardiovascular:     Rate and Rhythm: Normal rate.     Heart sounds: Normal heart sounds.  Pulmonary:     Effort: Pulmonary effort is normal. No respiratory distress.     Breath sounds: No stridor. Rhonchi present. No wheezing or rales.     Comments: Upper airway sounds  Rhonchi-mild and scattered   Pt coughs frequently    Chest:     Chest wall: No tenderness.  Musculoskeletal:     Cervical back: Normal range of motion and neck supple.  Lymphadenopathy:     Cervical: No cervical adenopathy.  Skin:    General: Skin is warm and dry.     Capillary Refill: Capillary refill takes less than 2 seconds.     Findings: No rash.  Neurological:     Mental Status: She is alert.     Cranial Nerves: No cranial nerve deficit.  Psychiatric:        Mood and Affect: Mood normal.           Assessment & Plan:   Problem List Items Addressed This Visit       Respiratory   Allergic rhinitis   Worse lately due to tree pollen  Pt takes allegra 180 mg daily and flonase  Has tried other antihistamines and not improved   Encouraged to continue these Treating bronchitis (post viral) with low dose prednisone   Avoid pollen when able Handout given         Relevant Orders   DG Chest 2 View (Completed)   Acute bronchitis - Primary   Reviewed virtual visit and plan from early this month  Did not tolerate the pred 40 and stopped after 1  day   Some rhonchi on exam  Cyclic cough   Cxr today: no acute findings   Prescription prednisone 20 mg daily for 5 d and discussed side effects in detail -pt thinks she can deal with it  Tessalon tid for at least 10-14 d to break cycle  Continue allergy meds        Relevant Orders   DG Chest 2 View (Completed)

## 2023-08-03 NOTE — Patient Instructions (Signed)
 Chest xray today  We will reach out with results   Drink fluids and rest  mucinex DM is good for cough and congestion  Take the generic tessalon pearles three times daily for at least 10-14 days to stop the cough cycle  Take the prednisone 20 mg daily for 5 days (take in am) , it will make you hyper and hungry while you are on it (we are keeping this low and short)  Nasal saline for congestion as needed  Tylenol for fever or pain or headache  Please alert [place] if symptoms worsen (if severe or short of breath please go to the ER)

## 2023-08-03 NOTE — Assessment & Plan Note (Addendum)
 Reviewed virtual visit and plan from early this month  Did not tolerate the pred 40 and stopped after 1 day   Some rhonchi on exam  Cyclic cough   Cxr today: no acute findings   Prescription prednisone 20 mg daily for 5 d and discussed side effects in detail -pt thinks she can deal with it  Tessalon tid for at least 10-14 d to break cycle  Continue allergy meds

## 2023-08-03 NOTE — Assessment & Plan Note (Addendum)
 Worse lately due to tree pollen  Pt takes allegra 180 mg daily and flonase  Has tried other antihistamines and not improved   Encouraged to continue these Treating bronchitis (post viral) with low dose prednisone   Avoid pollen when able Handout given

## 2023-09-08 ENCOUNTER — Telehealth: Payer: Self-pay | Admitting: Family Medicine

## 2023-09-08 DIAGNOSIS — Z13220 Encounter for screening for lipoid disorders: Secondary | ICD-10-CM | POA: Insufficient documentation

## 2023-09-08 DIAGNOSIS — E038 Other specified hypothyroidism: Secondary | ICD-10-CM

## 2023-09-08 DIAGNOSIS — R748 Abnormal levels of other serum enzymes: Secondary | ICD-10-CM

## 2023-09-08 DIAGNOSIS — M810 Age-related osteoporosis without current pathological fracture: Secondary | ICD-10-CM

## 2023-09-08 NOTE — Telephone Encounter (Signed)
-----   Message from Gerry Krone sent at 08/17/2023  3:57 PM EDT ----- Regarding: Lab orders for Wed, 4.30.25 Patient is scheduled for CPX labs, please order future labs, Thanks , Anselmo Kings

## 2023-09-09 ENCOUNTER — Other Ambulatory Visit: Payer: Self-pay

## 2023-09-13 ENCOUNTER — Telehealth: Admitting: Family Medicine

## 2023-09-13 DIAGNOSIS — J069 Acute upper respiratory infection, unspecified: Secondary | ICD-10-CM

## 2023-09-13 MED ORDER — FLUTICASONE PROPIONATE 50 MCG/ACT NA SUSP: 2.0000 | Freq: Every day | NASAL | 6 refills | Status: AC

## 2023-09-13 MED ORDER — BENZONATATE 200 MG PO CAPS: 200.0000 mg | ORAL_CAPSULE | Freq: Two times a day (BID) | ORAL | 0 refills | Status: DC | PRN

## 2023-09-13 NOTE — Progress Notes (Signed)

## 2023-09-14 ENCOUNTER — Encounter: Payer: Self-pay | Admitting: Family Medicine

## 2023-09-14 ENCOUNTER — Other Ambulatory Visit (INDEPENDENT_AMBULATORY_CARE_PROVIDER_SITE_OTHER)

## 2023-09-14 ENCOUNTER — Ambulatory Visit (INDEPENDENT_AMBULATORY_CARE_PROVIDER_SITE_OTHER)
Admission: RE | Admit: 2023-09-14 | Discharge: 2023-09-14 | Disposition: A | Discharge status: Home / Self Care | Source: Ambulatory Visit | Attending: Family Medicine | Admitting: Family Medicine

## 2023-09-14 ENCOUNTER — Ambulatory Visit (INDEPENDENT_AMBULATORY_CARE_PROVIDER_SITE_OTHER): Admitting: Family Medicine

## 2023-09-14 VITALS — BP 116/64 | HR 103 | Temp 98.3°F | Ht 59.5 in | Wt 143.1 lb

## 2023-09-14 DIAGNOSIS — R051 Acute cough: Secondary | ICD-10-CM

## 2023-09-14 DIAGNOSIS — R748 Abnormal levels of other serum enzymes: Secondary | ICD-10-CM

## 2023-09-14 DIAGNOSIS — Z13220 Encounter for screening for lipoid disorders: Secondary | ICD-10-CM | POA: Diagnosis not present

## 2023-09-14 DIAGNOSIS — M810 Age-related osteoporosis without current pathological fracture: Secondary | ICD-10-CM

## 2023-09-14 DIAGNOSIS — R058 Other specified cough: Secondary | ICD-10-CM | POA: Insufficient documentation

## 2023-09-14 DIAGNOSIS — E038 Other specified hypothyroidism: Secondary | ICD-10-CM | POA: Diagnosis not present

## 2023-09-14 LAB — CBC WITH DIFFERENTIAL/PLATELET
Basophils Absolute: 0 10*3/uL (ref 0.0–0.1)
Basophils Relative: 0.2 % (ref 0.0–3.0)
Eosinophils Absolute: 0 10*3/uL (ref 0.0–0.7)
Eosinophils Relative: 0 % (ref 0.0–5.0)
HCT: 38.9 % (ref 36.0–46.0)
Hemoglobin: 13.1 g/dL (ref 12.0–15.0)
Lymphocytes Relative: 6 % — ABNORMAL LOW (ref 12.0–46.0)
Lymphs Abs: 1 10*3/uL (ref 0.7–4.0)
MCHC: 33.7 g/dL (ref 30.0–36.0)
MCV: 94.8 fl (ref 78.0–100.0)
Monocytes Absolute: 2.3 10*3/uL — ABNORMAL HIGH (ref 0.1–1.0)
Monocytes Relative: 13.7 % — ABNORMAL HIGH (ref 3.0–12.0)
Neutro Abs: 13.3 10*3/uL — ABNORMAL HIGH (ref 1.4–7.7)
Neutrophils Relative %: 80.1 % — ABNORMAL HIGH (ref 43.0–77.0)
Platelets: 195 10*3/uL (ref 150.0–400.0)
RBC: 4.1 Mil/uL (ref 3.87–5.11)
RDW: 12.9 % (ref 11.5–15.5)
WBC: 16.6 10*3/uL — ABNORMAL HIGH (ref 4.0–10.5)

## 2023-09-14 LAB — COMPREHENSIVE METABOLIC PANEL WITH GFR
ALT: 39 U/L — ABNORMAL HIGH (ref 0–35)
AST: 27 U/L (ref 0–37)
Albumin: 4.1 g/dL (ref 3.5–5.2)
Alkaline Phosphatase: 203 U/L — ABNORMAL HIGH (ref 39–117)
BUN: 9 mg/dL (ref 6–23)
CO2: 30 meq/L (ref 19–32)
Calcium: 8.8 mg/dL (ref 8.4–10.5)
Chloride: 92 meq/L — ABNORMAL LOW (ref 96–112)
Creatinine, Ser: 0.71 mg/dL (ref 0.40–1.20)
GFR: 89.01 mL/min (ref >60.00)
Glucose, Bld: 106 mg/dL — ABNORMAL HIGH (ref 70–99)
Potassium: 3.9 meq/L (ref 3.5–5.1)
Sodium: 132 meq/L — ABNORMAL LOW (ref 135–145)
Total Bilirubin: 0.9 mg/dL (ref 0.2–1.2)
Total Protein: 6.2 g/dL (ref 6.0–8.3)

## 2023-09-14 LAB — LIPID PANEL
Cholesterol: 155 mg/dL (ref 0–200)
HDL: 60.3 mg/dL (ref >39.00)
LDL Cholesterol: 81 mg/dL (ref 0–99)
NonHDL: 94.55
Total CHOL/HDL Ratio: 3
Triglycerides: 67 mg/dL (ref 0.0–149.0)
VLDL: 13.4 mg/dL (ref 0.0–40.0)

## 2023-09-14 LAB — TSH: TSH: 5.28 u[IU]/mL (ref 0.35–5.50)

## 2023-09-14 LAB — VITAMIN D 25 HYDROXY (VIT D DEFICIENCY, FRACTURES): VITD: 72.07 ng/mL (ref 30.00–100.00)

## 2023-09-14 LAB — POC COVID19 BINAXNOW: SARS Coronavirus 2 Ag: NEGATIVE

## 2023-09-14 MED ORDER — HYDROCODONE BIT-HOMATROP MBR 5-1.5 MG/5ML PO SOLN: 5.0000 mL | Freq: Three times a day (TID) | ORAL | 0 refills | Status: DC | PRN

## 2023-09-14 MED ORDER — AZITHROMYCIN 250 MG PO TABS
ORAL_TABLET | ORAL | 0 refills | Status: AC
Start: 2023-09-14 — End: 2023-09-19

## 2023-09-14 MED ORDER — PREDNISONE 10 MG PO TABS: ORAL_TABLET | ORAL | 0 refills | Status: DC

## 2023-09-14 NOTE — Progress Notes (Signed)
 Subjective:    Patient ID: Debra Ford, female    DOB: 1956/03/04, 68 y.o.   MRN: [id number]  HPI  Wt Readings from Last 3 Encounters:  09/14/23 143 lb 2 oz (64.9 kg)  08/03/23 143 lb 8 oz (65.1 kg)  05/15/23 147 lb 4 oz (66.8 kg)   28.42 kg/m  Vitals:   09/14/23 0851  BP: 116/64  Pulse: (!) 103  Temp: 98.3 F (36.8 C)  SpO2: 91%    Pt presents for  Congestion with cough  Headache  Hoarseness   Pt did e visit Tessalon   Flonase Not helping   Started 3 d ago  Headache/between eyes  Congestion  Cough - occational productive  Mucous from nose -yellow  Some wheezing  Feels mucous in throat   No ear or throat pain   Husband had pneumonia recently    No fever   Over the counter [place] quil   Cxr DG Chest 2 View Result Date: 09/14/2023 CLINICAL DATA:  Productive cough EXAM: CHEST - 2 VIEW COMPARISON:  August 03, 2023 FINDINGS: The heart size and mediastinal contours are within normal limits. Both lungs are clear. The visualized skeletal structures are unremarkable. Chronic interstitial lung pattern and COPD with hyperinflation both lung bases Electronically Signed   By: Fredrich Jefferson M.D.   On: 09/14/2023 10:00      Results for orders placed or performed in visit on 09/14/23  POC COVID-19   Collection Time: 09/14/23  9:11 AM  Result Value Ref Range   SARS Coronavirus 2 Ag Negative Negative     Patient Active Problem List   Diagnosis Date Noted   Productive cough 09/14/2023   Lipid screening 09/08/2023   Allergic rhinitis 08/03/2023   Dysuria 04/13/2023   Cystitis 04/13/2023   Intertrigo 09/23/2021   Elevated liver enzymes 07/18/2018   Pre-operative examination 01/13/2017   Breast implant leak 08/06/2015   Encounter for routine gynecological examination 07/04/2015   Estrogen deficiency 07/04/2015   Routine general medical examination at a health care facility 06/25/2014   Family history of colon cancer 04/21/2011   Osteoporosis 12/29/2006    Hypothyroidism 11/30/2006   Obsessive-compulsive disorder 11/20/2006   Hearing loss 11/20/2006   Internal hemorrhoids 11/20/2006   TURNER'S SYNDROME 11/20/2006   Past Medical History:  Diagnosis Date   Allergy 2014   seasonal   Cancer (HCC)    skin cancer on nose   Cataract    removed both eyes   Elevated liver function tests    Hearing loss    Hyperthyroidism    OCD (obsessive compulsive disorder)    Osteopenia    PONV (postoperative nausea and vomiting)    1988 with breast implants   Turner's syndrome    Past Surgical History:  Procedure Laterality Date   AUGMENTATION MAMMAPLASTY     BREAST ENHANCEMENT SURGERY  1988   bilateral   COCHLEAR IMPLANT  2018   COCHLEAR IMPLANT Left 2024   COLONOSCOPY     2013   DILATION AND CURETTAGE OF UTERUS     EYE SURGERY  Cataracts   Upcoming-April, 2021   maxum  2012   hearing surgery   POLYPECTOMY     Social History   Tobacco Use   Smoking status: Never   Smokeless tobacco: Never  Substance Use Topics   Alcohol use: No    Alcohol/week: 0.0 standard drinks of alcohol   Drug use: No   Family History  Problem Relation Age of Onset  Colon cancer Mother 24   Cancer Mother    Diabetes Father    Colon polyps Father 59   Heart disease Father    Prostate cancer Father    Lung cancer Father    Cancer - Lung Father    Alcohol abuse Father    Alcoholism Brother    Heart attack Brother    Diabetes Maternal Grandmother    Diabetes Paternal Grandmother    Diabetes Paternal Grandfather    Alcohol abuse Brother    Birth defects Paternal Aunt    Hearing loss Maternal Uncle    Stomach cancer Neg Hx    Esophageal cancer Neg Hx    Rectal cancer Neg Hx    Breast cancer Neg Hx    Allergies  Allergen Reactions   Tetanus Toxoids     Hives (?)   Current Outpatient Medications on File Prior to Visit  Medication Sig Dispense Refill   ALPRAZolam  (XANAX ) 0.5 MG tablet TAKE 1/2 TO 1 (ONE-HALF TO ONE) TABLET BY MOUTH ONCE DAILY AS  NEEDED FOR ANXIETY 15 tablet 0   Ascorbic Acid (VITAMIN C) 1000 MG tablet Take 1,000 mg by mouth daily.     benzonatate  (TESSALON ) 200 MG capsule Take 1 capsule (200 mg total) by mouth 2 (two) times daily as needed for cough. 20 capsule 0   CALCIUM CITRATE-VITAMIN D  PO Take 2 tablets by mouth daily.     fexofenadine (ALLEGRA) 180 MG tablet Take 180 mg by mouth daily.     FLUoxetine  (PROZAC ) 40 MG capsule Take 1 capsule (40 mg total) by mouth daily. 90 capsule 3   fluticasone (FLONASE) 50 MCG/ACT nasal spray Place 2 sprays into both nostrils daily. 16 g 6   folic acid (FOLVITE) 1 MG tablet Take by mouth.     levothyroxine  (SYNTHROID ) 100 MCG tablet Take 1 tablet (100 mcg total) by mouth daily. 90 tablet 3   Multiple Vitamin (MULTIVITAMIN) tablet Take 1 tablet by mouth daily.     oxybutynin  (DITROPAN -XL) 10 MG 24 hr tablet TAKE 1 TABLET BY MOUTH AT BEDTIME 90 tablet 2   Vitamin D , Ergocalciferol , 2000 units CAPS Take 1 capsule by mouth daily.     VITAMIN E PO Take by mouth.     No current facility-administered medications on file prior to visit.    Review of Systems  Constitutional:  Positive for appetite change and fatigue. Negative for fever.  HENT:  Positive for congestion, postnasal drip, rhinorrhea, sinus pressure and voice change. Negative for ear pain, sneezing and sore throat.   Eyes:  Negative for pain and discharge.  Respiratory:  Positive for cough and wheezing. Negative for shortness of breath and stridor.   Cardiovascular:  Negative for chest pain.  Gastrointestinal:  Negative for diarrhea, nausea and vomiting.  Genitourinary:  Negative for frequency, hematuria and urgency.  Musculoskeletal:  Negative for arthralgias and myalgias.  Skin:  Negative for rash.  Neurological:  Positive for headaches. Negative for dizziness, weakness and light-headedness.  Psychiatric/Behavioral:  Negative for confusion and dysphoric mood.        Objective:   Physical Exam Constitutional:       General: She is not in acute distress.    Appearance: Normal appearance. She is well-developed and normal weight. She is not ill-appearing, toxic-appearing or diaphoretic.  HENT:     Head: Normocephalic and atraumatic.     Comments: Nares are injected and congested      Right Ear: External ear normal.  Left Ear: External ear normal.     Ears:     Comments: Hearing aide    Nose: Congestion and rhinorrhea present.     Mouth/Throat:     Mouth: Mucous membranes are moist.     Pharynx: Oropharynx is clear. No oropharyngeal exudate or posterior oropharyngeal erythema.     Comments: Clear pnd  Eyes:     General:        Right eye: No discharge.        Left eye: No discharge.     Conjunctiva/sclera: Conjunctivae normal.     Pupils: Pupils are equal, round, and reactive to light.  Cardiovascular:     Rate and Rhythm: Normal rate.     Heart sounds: Normal heart sounds.  Pulmonary:     Effort: Pulmonary effort is normal. No respiratory distress.     Breath sounds: No stridor. Wheezing and rhonchi present. No rales.     Comments: Upper airway sounds and scattered rhonchi  Some end exp wheezes  Chest:     Chest wall: No tenderness.  Musculoskeletal:     Cervical back: Normal range of motion and neck supple.  Lymphadenopathy:     Cervical: No cervical adenopathy.  Skin:    General: Skin is warm and dry.     Capillary Refill: Capillary refill takes less than 2 seconds.     Findings: No rash.  Neurological:     Mental Status: She is alert.     Cranial Nerves: No cranial nerve deficit.  Psychiatric:        Mood and Affect: Mood normal.           Assessment & Plan:   Problem List Items Addressed This Visit       Other   Productive cough - Primary   Exp to husband with pneumonia  Productive cough/some congestion and headache  Pulse ox 91% / no shortness of breath however Upper airway sounds and some wheezing Cxr: some chronic copd changes /hyperinflation (? Of  copd/chronic bronchitis on imaging in past)  Sent prednisone  40 mg taper (reviewed side effects) , zpak, hycodan for cough with caution of sedation and falls  Disc symptomatic care - see instructions on AVS  Call back and Er precautions noted in detail today        Relevant Orders   DG Chest 2 View (Completed)   Other Visit Diagnoses       Acute cough       Relevant Orders   POC COVID-19 (Completed)

## 2023-09-14 NOTE — Patient Instructions (Addendum)
 Chest xray now  We will call when this is read and send in medicine   Most likely both prednisone  and antibiotic and something for cough  I want to see the chest xray before we decide  Drink fluids and rest  mucinex DM is good for cough and congestion  Nasal saline for congestion as needed  Tylenol for fever or pain or headache  Please alert us  if symptoms worsen (if severe or short of breath please go to the ER)    Take prednisone  for bronchitis /wheezing Also zithromax  Try hycodan for cough-use with caution of sedation or falls as this is a narcotic    Update if not starting to improve in a week or if worsening

## 2023-09-14 NOTE — Assessment & Plan Note (Addendum)
 Exp to husband with pneumonia  Productive cough/some congestion and headache  Pulse ox 91% / no shortness of breath however Upper airway sounds and some wheezing Cxr: some chronic copd changes /hyperinflation (? Of copd/chronic bronchitis on imaging in past)  Sent prednisone  40 mg taper (reviewed side effects) , zpak, hycodan for cough with caution of sedation and falls  Disc symptomatic care - see instructions on AVS  Call back and Er precautions noted in detail today

## 2023-09-16 ENCOUNTER — Ambulatory Visit (INDEPENDENT_AMBULATORY_CARE_PROVIDER_SITE_OTHER): Payer: Self-pay | Admitting: Family Medicine

## 2023-09-16 ENCOUNTER — Encounter: Payer: Self-pay | Admitting: Family Medicine

## 2023-09-16 VITALS — BP 126/68 | HR 74 | Temp 98.1°F | Ht 59.25 in | Wt 141.4 lb

## 2023-09-16 DIAGNOSIS — E038 Other specified hypothyroidism: Secondary | ICD-10-CM

## 2023-09-16 DIAGNOSIS — E2839 Other primary ovarian failure: Secondary | ICD-10-CM

## 2023-09-16 DIAGNOSIS — Z0000 Encounter for general adult medical examination without abnormal findings: Secondary | ICD-10-CM | POA: Insufficient documentation

## 2023-09-16 DIAGNOSIS — R748 Abnormal levels of other serum enzymes: Secondary | ICD-10-CM | POA: Diagnosis not present

## 2023-09-16 DIAGNOSIS — R058 Other specified cough: Secondary | ICD-10-CM

## 2023-09-16 DIAGNOSIS — H9193 Unspecified hearing loss, bilateral: Secondary | ICD-10-CM

## 2023-09-16 DIAGNOSIS — F429 Obsessive-compulsive disorder, unspecified: Secondary | ICD-10-CM

## 2023-09-16 DIAGNOSIS — Z13220 Encounter for screening for lipoid disorders: Secondary | ICD-10-CM

## 2023-09-16 DIAGNOSIS — Q969 Turner's syndrome, unspecified: Secondary | ICD-10-CM | POA: Diagnosis not present

## 2023-09-16 DIAGNOSIS — M810 Age-related osteoporosis without current pathological fracture: Secondary | ICD-10-CM

## 2023-09-16 MED ORDER — LEVOTHYROXINE SODIUM 100 MCG PO TABS: 100.0000 ug | ORAL_TABLET | Freq: Every day | ORAL | 3 refills | Status: AC

## 2023-09-16 MED ORDER — ALPRAZOLAM 0.5 MG PO TABS: ORAL_TABLET | ORAL | 0 refills | Status: AC

## 2023-09-16 MED ORDER — FLUOXETINE HCL 40 MG PO CAPS: 40.0000 mg | ORAL_CAPSULE | Freq: Every day | ORAL | 3 refills | Status: AC

## 2023-09-16 NOTE — Assessment & Plan Note (Signed)
 Hypothyroidism - stable Pt has no clinical changes No change in energy level/ hair or skin/ edema and no tremor Lab Results  Component Value Date   TSH 5.28 09/14/2023    Continue levothyroxine  100 mcg daily

## 2023-09-16 NOTE — Assessment & Plan Note (Signed)
 No new developments Had echo in 2017  No cardiac symptoms   Alk phos and ALT remain mildly high

## 2023-09-16 NOTE — Patient Instructions (Addendum)
 You are due for the prevnar 20 vaccine for pneumonia -to consider when feeling better  If you ever change your mind about shingrix vaccine  If you are interested in the new shingles vaccine (Shingrix) - call your local pharmacy to check on coverage and availability   When feeling better- get back to exercise  Add some strength training to your routine, this is important for bone and brain health and can reduce your risk of falls and help your body use insulin properly and regulate weight  Light weights, exercise bands , and internet videos are a good way to start  Yoga (chair or regular), machines , floor exercises or a gym with machines are also good options    You can update your advance directive by filling out the blue packet and having it notarized   We will want to repeat some labs after you recover from this bronchitis   Follow up in 1-2 months for a re check   Take care of yourself  If bronchitis symptoms get worse let me know

## 2023-09-16 NOTE — Assessment & Plan Note (Signed)
Has cochlear implant

## 2023-09-16 NOTE — Assessment & Plan Note (Signed)
 Dexa 03/2022  Will be due in November No falls or fracture  Discussed fall prevention, supplements and exercise for bone density  On drug holiday from [facility]   Good D level  Good exercise

## 2023-09-16 NOTE — Assessment & Plan Note (Signed)
 Starting to improve with prednisone  and zpack and cough control medication

## 2023-09-16 NOTE — Assessment & Plan Note (Signed)
 Disc goals for lipids and reasons to control them Rev last labs with pt Rev low sat fat diet in detail LDL of 81 Overall good

## 2023-09-16 NOTE — Assessment & Plan Note (Signed)
 Stable In setting of turner's syndrome  Will continue to watch  Also alk phos

## 2023-09-16 NOTE — Assessment & Plan Note (Signed)
 Continues to do well with fluoxetine  40 mg daily  Mood is overall good

## 2023-09-16 NOTE — Assessment & Plan Note (Signed)
 Reviewed health habits including diet and exercise and skin cancer prevention Reviewed appropriate screening tests for age  Also reviewed health mt list, fam hx and immunization status , as well as social and family history   See HPI Labs reviewed and ordered Health Maintenance  Topic Date Due   HIV Screening  Never done   Zoster (Shingles) Vaccine (1 of 2) 12/08/1976   Pneumonia Vaccine (2 of 2 - PCV) 01/13/2018   Pap with HPV screening  07/16/2023   COVID-19 Vaccine (1) 10/02/2023*   Hepatitis C Screening  09/15/2024*   Flu Shot  12/11/2023   Mammogram  07/13/2024   Medicare Annual Wellness Visit  09/15/2024   Colon Cancer Screening  11/29/2031   DEXA scan (bone density measurement)  Completed   HPV Vaccine  Aged Out   Meningitis B Vaccine  Aged Out   DTaP/Tdap/Td vaccine  Discontinued  *Topic was postponed. The date shown is not the original due date.    Discussed fall prevention, supplements and exercise for bone density  No falls or fractures  Mammo utd 07/2023 Dexa utd 03/2022 Pap normal 2020 Declines shingrix  Colonoscopy utd 2023  May consider prevnar 20 when feeling better from current bronchitis  Allergic to tetanus vaccine  No problems with ADLs Reviewed vision screen No hearing screen-sees specialist for regular hearing check with cochlear implant  PHQ 0 No falls Normal cognitive screen  Reviewed care team

## 2023-09-16 NOTE — Progress Notes (Signed)
 Subjective:    Debra Ford is a 68 y.o. female who presents for a Welcome to Medicare exam.   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[facility] [place] for 20 years  Also breast implants   Mammogram 07/2023 Self breast exam-no lumps   No gyn problems No new partners   Gyn Pap 07/2018 normal with neg HPV  Hep C screen -low risk /declines   Zoster vaccine-had the live  Declines shingrix   Pna 23 vaccine in 2018  Will hold off on prevnar due to present illness /bronchitis   Colonoscopy 11/2021  Mother had colon cancer at 58    Had tetanus shot 2014-allergic to it and cannot have another one   Labs  Lab Results  Component Value Date   WBC 16.6 (H) 09/14/2023   HGB 13.1 09/14/2023   HCT 38.9 09/14/2023   MCV 94.8 09/14/2023   PLT 195.0 09/14/2023  Had uri with this lab   Lab Results  Component Value Date   NA 132 (L) 09/14/2023   K 3.9 09/14/2023   CO2 30 09/14/2023   GLUCOSE 106 (H) 09/14/2023   BUN 9 09/14/2023   CREATININE 0.71 09/14/2023   CALCIUM 8.8 09/14/2023   GFR 89.01 09/14/2023   GFRNONAA >60 08/23/2012   History of elevated liver enzymes  Normal us  abd in 2020 Lab Results  Component Value Date   ALT 39 (H) 09/14/2023   AST 27 09/14/2023   ALKPHOS 203 (H) 09/14/2023   BILITOT 0.9 09/14/2023   Was talking ibuprofen for her illness  Normal liver us  in 2020      Lipid  screen Lab Results  Component Value Date   CHOL 155 09/14/2023   CHOL 179 09/08/2022   CHOL 198 09/16/2021   Lab Results  Component Value Date   HDL 60.30 09/14/2023   HDL 68.30 09/08/2022   HDL 71.00 09/16/2021   Lab Results  Component Value Date   LDLCALC 81 09/14/2023   LDLCALC 84 09/08/2022   LDLCALC 99 09/16/2021   Lab Results  Component Value Date   TRIG 67.0 09/14/2023   TRIG 135.0 09/08/2022   TRIG 137.0 09/16/2021   Lab Results  Component Value Date   CHOLHDL 3 09/14/2023   CHOLHDL 3 09/08/2022   CHOLHDL 3 09/16/2021   No results found for: "LDLDIRECT"      Objective:    Today's Vitals   09/16/23 0813  BP: 126/68  Pulse: 74  Temp: 98.1 F (36.7 C)  TempSrc: Oral  SpO2: 98%  Weight: 141 lb 6 oz (64.1 kg)  Height: 4' 11.25" (1.505 m)  Body mass index is 28.31 kg/m.  Medications Outpatient Encounter Medications as of 09/16/2023  Medication Sig   Ascorbic Acid (VITAMIN C) 1000 MG tablet Take 1,000 mg by mouth daily.   azithromycin (ZITHROMAX) 250 MG tablet  Take 2 tablets on day 1, then 1 tablet daily on days 2 through 5   benzonatate  (TESSALON ) 200 MG capsule Take 1 capsule (200 mg total) by mouth 2 (two) times daily as needed for cough.   CALCIUM CITRATE-VITAMIN D  PO Take 2 tablets by mouth daily.   fexofenadine (ALLEGRA) 180 MG tablet Take 180 mg by mouth daily.   fluticasone (FLONASE) 50 MCG/ACT nasal spray Place 2 sprays into both nostrils daily.   folic acid (FOLVITE) 1 MG tablet Take by mouth.   HYDROcodone  bit-homatropine (HYCODAN) 5-1.5 MG/5ML syrup Take 5 mLs by mouth every 8 (eight) hours as needed for cough. Caution of sedation   Multiple Vitamin (MULTIVITAMIN) tablet Take 1 tablet by mouth daily.   oxybutynin  (DITROPAN -XL) 10 MG 24 hr tablet TAKE 1 TABLET BY MOUTH AT BEDTIME   predniSONE  (DELTASONE ) 10 MG tablet Take 4 pills once daily by mouth for 3 days, then 3 pills daily for 3 days, then 2 pills daily for 3 days then 1 pill daily for 3  days then stop   Vitamin D , Ergocalciferol , 2000 units CAPS Take 1 capsule by mouth daily.   VITAMIN E PO Take by mouth.   [DISCONTINUED] ALPRAZolam  (XANAX ) 0.5 MG tablet TAKE 1/2 TO 1 (ONE-HALF TO ONE) TABLET BY MOUTH ONCE DAILY AS NEEDED FOR ANXIETY   [DISCONTINUED] FLUoxetine  (PROZAC ) 40 MG capsule Take 1 capsule (40 mg total) by mouth daily.   [DISCONTINUED] levothyroxine  (SYNTHROID ) 100 MCG tablet Take 1 tablet (100 mcg total) by mouth daily.   ALPRAZolam  (XANAX ) 0.5 MG tablet TAKE 1/2 TO 1 (ONE-HALF TO ONE) TABLET BY MOUTH ONCE DAILY AS NEEDED FOR ANXIETY   FLUoxetine  (PROZAC ) 40 MG capsule Take 1 capsule (40 mg total) by mouth daily.   levothyroxine  (SYNTHROID ) 100 MCG tablet Take 1 tablet (100 mcg total) by mouth daily.   No facility-administered encounter medications on file as of 09/16/2023.     History: Past Medical History:  Diagnosis Date   Allergy 2014   seasonal   Cancer (HCC)    skin cancer on nose   Cataract    removed both eyes   Elevated liver function tests    Hearing loss    Hyperthyroidism    OCD (obsessive compulsive disorder)    Osteopenia    PONV (postoperative nausea and vomiting)    1988 with breast implants   Turner's syndrome    Past Surgical History:  Procedure Laterality Date   AUGMENTATION MAMMAPLASTY     BREAST ENHANCEMENT SURGERY  1988   bilateral   COCHLEAR IMPLANT  2018   COCHLEAR IMPLANT Left 2024   COLONOSCOPY     2013   DILATION AND CURETTAGE OF UTERUS     EYE SURGERY  Cataracts   Upcoming-April, 2021   maxum  2012   hearing surgery   POLYPECTOMY      Family History  Problem Relation Age of Onset   Colon cancer Mother 60   Cancer Mother    Diabetes Father    Colon polyps Father 41   Heart disease Father    Prostate cancer Father    Lung cancer Father    Cancer - Lung Father    Alcohol abuse Father    Alcoholism Brother    Heart attack Brother    Diabetes Maternal Grandmother    Diabetes Paternal Grandmother    Diabetes  Paternal Grandfather    Alcohol abuse Brother    Birth defects Paternal Aunt    Hearing  loss Maternal Uncle    Stomach cancer Neg Hx    Esophageal cancer Neg Hx    Rectal cancer Neg Hx    Breast cancer Neg Hx    Social History   Occupational History   Not on file  Tobacco Use   Smoking status: Never   Smokeless tobacco: Never  Substance and Sexual Activity   Alcohol use: No   Drug use: No   Sexual activity: Yes    Birth control/protection: None    Tobacco Counseling Counseling given: Not Answered   Immunizations and Health Maintenance Immunization History  Administered Date(s) Administered   Influenza Split 04/21/2011, 02/18/2013, 01/11/2018   Influenza Whole 02/23/2002, 02/13/2009, 02/27/2010, 02/20/2012   Influenza,inj,Quad PF,6+ Mos 01/13/2017, 01/12/2018   Influenza-Unspecified 03/17/2014, 03/01/2015, 01/25/2016   Pneumococcal Polysaccharide-23 01/13/2017   Td 07/12/2002   Tdap 06/30/2012   Zoster, Live 07/15/2016   Health Maintenance Due  Topic Date Due   HIV Screening  Never done   Zoster Vaccines- Shingrix (1 of 2) 12/08/1976   Pneumonia Vaccine 40+ Years old (2 of 2 - PCV) 01/13/2018   Cervical Cancer Screening (HPV/Pap Cotest)  07/16/2023    Activities of Daily Living    09/16/2023    8:17 AM  In your present state of health, do you have any difficulty performing the following activities:  Hearing? 1  Vision? 0  Difficulty concentrating or making decisions? 0  Walking or climbing stairs? 0  Dressing or bathing? 0  Doing errands, shopping? 0  Preparing Food and eating ? N  Using the Toilet? N  In the past six months, have you accidently leaked urine? N  Do you have problems with loss of bowel control? N  Managing your Medications? N  Managing your Finances? N  Housekeeping or managing your Housekeeping? N    Physical Exam   Physical Exam (optional), or other factors deemed appropriate based on the beneficiary's medical and social history and  current clinical standards.   Advanced Directives: Does Patient Have a Medical Advance Directive?: No Would patient like information on creating a medical advance directive?: Yes (ED - Information included in AVS) Has poa but not living will  Husb is poa         Assessment:    This is a routine wellness examination for this patient .   Vision/Hearing screen Vision Screening   Right eye Left eye Both eyes  Without correction 20/30 20/40 20/30   With correction     Hearing Screening - Comments:: Pt has hearing aids/implants. Hearing test not done Pt has cochlear implant and sees hearing specialist regularly  Forgot glasses today-sees eye specialist    Goals      Increase physical activity       Depression Screen    09/16/2023    8:25 AM 09/16/2023    8:19 AM 05/15/2023    8:47 AM 04/13/2023    2:11 PM  PHQ 2/9 Scores  PHQ - 2 Score 0 0 0 0  PHQ- 9 Score   0     Takes fluoxetine  40 mg for OCD    Fall Risk    09/16/2023    8:24 AM  Fall Risk   Falls in the past year? 0  Number falls in past yr: 0  Injury with Fall? 0  Risk for fall due to : No Fall Risks  Follow up Falls evaluation completed    Cognitive Function:        09/16/2023  8:25 AM  6CIT Screen  What Year? 0 points  What month? 0 points  What time? 0 points  Count back from 20 0 points  Months in reverse 0 points  Repeat phrase 0 points  Total Score 0 points    Patient Care Team: Tower, Manley Seeds, MD as PCP - General [facility]-audiology Jodelle Mungo- ENT [facility]and[facility]am- GI    Plan:    Problem List Items Addressed This Visit       Endocrine   Hypothyroidism   Hypothyroidism - stable Pt has no clinical changes No change in energy level/ hair or skin/ edema and no tremor Lab Results  Component Value Date   TSH 5.28 09/14/2023    Continue levothyroxine  100 mcg daily       Relevant Medications   levothyroxine  (SYNTHROID ) 100 MCG tablet     Nervous and Auditory   Hearing loss   Has cochlear  implant         Musculoskeletal and Integument   Osteoporosis   Dexa 03/2022  Will be due in November No falls or fracture  Discussed fall prevention, supplements and exercise for bone density  On drug holiday from boniva   Good D level  Good exercise         Genitourinary   TURNER'S SYNDROME   No new developments Had echo in 2017  No cardiac symptoms   Alk phos and ALT remain mildly high         Other   Welcome to Medicare preventive visit - Primary   Reviewed health habits including diet and exercise and skin cancer prevention Reviewed appropriate screening tests for age  Also reviewed health mt list, fam hx and immunization status , as well as social and family history   See HPI Labs reviewed and ordered Health Maintenance  Topic Date Due   HIV Screening  Never done   Zoster (Shingles) Vaccine (1 of 2) 12/08/1976   Pneumonia Vaccine (2 of 2 - PCV) 01/13/2018   Pap with HPV screening  07/16/2023   COVID-19 Vaccine (1) 10/02/2023*   Hepatitis C Screening  09/15/2024*   Flu Shot  12/11/2023   Mammogram  07/13/2024   Medicare Annual Wellness Visit  09/15/2024   Colon Cancer Screening  11/29/2031   DEXA scan (bone density measurement)  Completed   HPV Vaccine  Aged Out   Meningitis B Vaccine  Aged Out   DTaP/Tdap/Td vaccine  Discontinued  *Topic was postponed. The date shown is not the original due date.    Discussed fall prevention, supplements and exercise for bone density  No falls or fractures  Mammo utd 07/2023 Dexa utd 03/2022 Pap normal 2020 Declines shingrix  Colonoscopy utd 2023  May consider prevnar 20 when feeling better from current bronchitis  Allergic to tetanus vaccine  No problems with ADLs Reviewed vision screen No hearing screen-sees specialist for regular hearing check with cochlear implant  PHQ 0 No falls Normal cognitive screen  Reviewed care team        Productive cough   Starting to improve with prednisone  and zpack and cough  control medication        Obsessive-compulsive disorder   Continues to do well with fluoxetine  40 mg daily  Mood is overall good       Lipid screening   Disc goals for lipids and reasons to control them Rev last labs with pt Rev low sat fat diet in detail LDL of 81 Overall good      Elevated  liver enzymes   Stable In setting of turner's syndrome  Will continue to watch  Also alk phos          I have personally reviewed and noted the following in the patient's chart:   Medical and social history Use of alcohol, tobacco or illicit drugs  Current medications and supplements including opioid prescriptions. Patient is not currently taking opioid prescriptions. Functional ability and status Nutritional status Physical activity Advanced directives List of other physicians Hospitalizations, surgeries, and ER visits in previous 12 months Vitals Screenings to include cognitive, depression, and falls Referrals and appointments  In addition, I have reviewed and discussed with patient certain preventive protocols, quality metrics, and best practice recommendations. A written personalized care plan for preventive services as well as general preventive health recommendations were provided to patient.     Deri Fleet, MD 09/16/2023

## 2023-11-25 ENCOUNTER — Ambulatory Visit: Payer: Self-pay | Admitting: Family Medicine

## 2023-11-25 ENCOUNTER — Ambulatory Visit (INDEPENDENT_AMBULATORY_CARE_PROVIDER_SITE_OTHER): Admitting: Family Medicine

## 2023-11-25 ENCOUNTER — Encounter: Payer: Self-pay | Admitting: Family Medicine

## 2023-11-25 VITALS — BP 130/70 | HR 68 | Temp 98.5°F | Ht 59.25 in | Wt 147.0 lb

## 2023-11-25 DIAGNOSIS — E871 Hypo-osmolality and hyponatremia: Secondary | ICD-10-CM | POA: Insufficient documentation

## 2023-11-25 DIAGNOSIS — R748 Abnormal levels of other serum enzymes: Secondary | ICD-10-CM | POA: Diagnosis not present

## 2023-11-25 DIAGNOSIS — R058 Other specified cough: Secondary | ICD-10-CM | POA: Diagnosis not present

## 2023-11-25 DIAGNOSIS — Q969 Turner's syndrome, unspecified: Secondary | ICD-10-CM

## 2023-11-25 DIAGNOSIS — D72829 Elevated white blood cell count, unspecified: Secondary | ICD-10-CM

## 2023-11-25 DIAGNOSIS — E038 Other specified hypothyroidism: Secondary | ICD-10-CM | POA: Diagnosis not present

## 2023-11-25 DIAGNOSIS — R7309 Other abnormal glucose: Secondary | ICD-10-CM | POA: Insufficient documentation

## 2023-11-25 DIAGNOSIS — Z23 Encounter for immunization: Secondary | ICD-10-CM

## 2023-11-25 LAB — HEPATIC FUNCTION PANEL
ALT: 29 U/L (ref 0–35)
AST: 22 U/L (ref 0–37)
Albumin: 4.3 g/dL (ref 3.5–5.2)
Alkaline Phosphatase: 166 U/L — ABNORMAL HIGH (ref 39–117)
Bilirubin, Direct: 0.1 mg/dL (ref 0.0–0.3)
Total Bilirubin: 0.6 mg/dL (ref 0.2–1.2)
Total Protein: 6.1 g/dL (ref 6.0–8.3)

## 2023-11-25 LAB — HEMOGLOBIN A1C: Hgb A1c MFr Bld: 5.5 % (ref 4.6–6.5)

## 2023-11-25 LAB — CBC WITH DIFFERENTIAL/PLATELET
Basophils Absolute: 0 K/uL (ref 0.0–0.1)
Basophils Relative: 0.6 % (ref 0.0–3.0)
Eosinophils Absolute: 0.1 K/uL (ref 0.0–0.7)
Eosinophils Relative: 1.2 % (ref 0.0–5.0)
HCT: 39.2 % (ref 36.0–46.0)
Hemoglobin: 13.1 g/dL (ref 12.0–15.0)
Lymphocytes Relative: 20.7 % (ref 12.0–46.0)
Lymphs Abs: 1.8 K/uL (ref 0.7–4.0)
MCHC: 33.4 g/dL (ref 30.0–36.0)
MCV: 95 fl (ref 78.0–100.0)
Monocytes Absolute: 1 K/uL (ref 0.1–1.0)
Monocytes Relative: 11.5 % (ref 3.0–12.0)
Neutro Abs: 5.6 K/uL (ref 1.4–7.7)
Neutrophils Relative %: 66 % (ref 43.0–77.0)
Platelets: 254 K/uL (ref 150.0–400.0)
RBC: 4.13 Mil/uL (ref 3.87–5.11)
RDW: 12.6 % (ref 11.5–15.5)
WBC: 8.5 K/uL (ref 4.0–10.5)

## 2023-11-25 LAB — BASIC METABOLIC PANEL WITH GFR
BUN: 14 mg/dL (ref 6–23)
CO2: 34 meq/L — ABNORMAL HIGH (ref 19–32)
Calcium: 9.7 mg/dL (ref 8.4–10.5)
Chloride: 99 meq/L (ref 96–112)
Creatinine, Ser: 0.62 mg/dL (ref 0.40–1.20)
GFR: 93.1 mL/min (ref >60.00)
Glucose, Bld: 85 mg/dL (ref 70–99)
Potassium: 4.5 meq/L (ref 3.5–5.1)
Sodium: 139 meq/L (ref 135–145)

## 2023-11-25 NOTE — Assessment & Plan Note (Signed)
 106 fasting A1c ordered today disc imp of low glycemic diet and wt loss to prevent DM2

## 2023-11-25 NOTE — Assessment & Plan Note (Signed)
 May be reason for elevated alk phos and transaminase

## 2023-11-25 NOTE — Progress Notes (Signed)
 Subjective:    Patient ID: Debra Ford, female    DOB: 04/15/1956, 68 y.o.   MRN: [id number]  HPI  Wt Readings from Last 3 Encounters:  11/25/23 147 lb (66.7 kg)  09/16/23 141 lb 6 oz (64.1 kg)  09/14/23 143 lb 2 oz (64.9 kg)   29.44 kg/m  Vitals:   11/25/23 0822  BP: 130/70  Pulse: 68  Temp: 98.5 F (36.9 C)  SpO2: 98%   Pt presents for follow up of chronic health problems  Hyponatremia  Bronchitis  Need for pna vaccine  Elevated wbc (in setting of respiratory illness) and had been on prednisone   Elevated ALT and alk phos   Last liver us  was normal in 2020    Lab Results  Component Value Date   NA 132 (L) 09/14/2023   K 3.9 09/14/2023   CO2 30 09/14/2023   GLUCOSE 106 (H) 09/14/2023   BUN 9 09/14/2023   CREATININE 0.71 09/14/2023   CALCIUM 8.8 09/14/2023   GFR 89.01 09/14/2023   GFRNONAA >60 08/23/2012   Pt noticed when she was sick- gagged brushing teeth and vomited  No diarrhea   Drinks a fair amt of fluid  3 coffee  Fluid with meals Water in between   Was fasting for lab Glucose 106 Eats some sweets every day, not a lot   DM in some grandparents   Needs her pna shot      Lab Results  Component Value Date   ALT 39 (H) 09/14/2023   AST 27 09/14/2023   ALKPHOS 203 (H) 09/14/2023   BILITOT 0.9 09/14/2023    Lab Results  Component Value Date   WBC 16.6 (H) 09/14/2023   HGB 13.1 09/14/2023   HCT 38.9 09/14/2023   MCV 94.8 09/14/2023   PLT 195.0 09/14/2023   Cxr in may   FINDINGS: The heart size and mediastinal contours are within normal limits. Both lungs are clear. The visualized skeletal structures are unremarkable. Chronic interstitial lung pattern and COPD with hyperinflation both lung bases  Finally better/symptoms are gone  Feels back to herself      Patient Active Problem List   Diagnosis Date Noted   Hyponatremia 11/25/2023   Elevated glucose level 11/25/2023   Welcome to Medicare preventive visit 09/16/2023    Productive cough 09/14/2023   Lipid screening 09/08/2023   Allergic rhinitis 08/03/2023   Intertrigo 09/23/2021   Elevated WBC count 09/20/2019   Elevated liver enzymes 07/18/2018   Breast implant leak 08/06/2015   Encounter for routine gynecological examination 07/04/2015   Estrogen deficiency 07/04/2015   Routine general medical examination at a health care facility 06/25/2014   Family history of colon cancer 04/21/2011   Osteoporosis 12/29/2006   Hypothyroidism 11/30/2006   Obsessive-compulsive disorder 11/20/2006   Hearing loss 11/20/2006   Internal hemorrhoids 11/20/2006   TURNER'S SYNDROME 11/20/2006   Past Medical History:  Diagnosis Date   Allergy 2014   seasonal   Cancer (HCC)    skin cancer on nose   Cataract    removed both eyes   Elevated liver function tests    Hearing loss    Hyperthyroidism    OCD (obsessive compulsive disorder)    Osteopenia    PONV (postoperative nausea and vomiting)    1988 with breast implants   Turner's syndrome    Past Surgical History:  Procedure Laterality Date   AUGMENTATION MAMMAPLASTY     BREAST ENHANCEMENT SURGERY  1988   bilateral  COCHLEAR IMPLANT  2018   COCHLEAR IMPLANT Left 2024   COLONOSCOPY     2013   DILATION AND CURETTAGE OF UTERUS     EYE SURGERY  Cataracts   Upcoming-April, 2021   maxum  2012   hearing surgery   POLYPECTOMY     Social History   Tobacco Use   Smoking status: Never   Smokeless tobacco: Never  Substance Use Topics   Alcohol use: No   Drug use: No   Family History  Problem Relation Age of Onset   Colon cancer Mother 63   Cancer Mother    Diabetes Father    Colon polyps Father 32   Heart disease Father    Prostate cancer Father    Lung cancer Father    Cancer - Lung Father    Alcohol abuse Father    Alcoholism Brother    Heart attack Brother    Diabetes Maternal Grandmother    Diabetes Paternal Grandmother    Diabetes Paternal Grandfather    Alcohol abuse Brother     Birth defects Paternal Aunt    Hearing loss Maternal Uncle    Stomach cancer Neg Hx    Esophageal cancer Neg Hx    Rectal cancer Neg Hx    Breast cancer Neg Hx    Allergies  Allergen Reactions   Tetanus Toxoids     Hives (?)   Current Outpatient Medications on File Prior to Visit  Medication Sig Dispense Refill   ALPRAZolam  (XANAX ) 0.5 MG tablet TAKE 1/2 TO 1 (ONE-HALF TO ONE) TABLET BY MOUTH ONCE DAILY AS NEEDED FOR ANXIETY 15 tablet 0   Ascorbic Acid (VITAMIN C) 1000 MG tablet Take 1,000 mg by mouth daily.     CALCIUM CITRATE-VITAMIN D  PO Take 2 tablets by mouth daily.     fexofenadine (ALLEGRA) 180 MG tablet Take 180 mg by mouth daily.     FLUoxetine  (PROZAC ) 40 MG capsule Take 1 capsule (40 mg total) by mouth daily. 90 capsule 3   fluticasone  (FLONASE ) 50 MCG/ACT nasal spray Place 2 sprays into both nostrils daily. 16 g 6   folic acid (FOLVITE) 1 MG tablet Take by mouth.     levothyroxine  (SYNTHROID ) 100 MCG tablet Take 1 tablet (100 mcg total) by mouth daily. 90 tablet 3   Multiple Vitamin (MULTIVITAMIN) tablet Take 1 tablet by mouth daily.     oxybutynin  (DITROPAN -XL) 10 MG 24 hr tablet TAKE 1 TABLET BY MOUTH AT BEDTIME 90 tablet 2   Vitamin D , Ergocalciferol , 2000 units CAPS Take 1 capsule by mouth daily.     VITAMIN E PO Take by mouth.     No current facility-administered medications on file prior to visit.    Review of Systems  Constitutional:  Negative for activity change, appetite change, fatigue, fever and unexpected weight change.  HENT:  Negative for congestion, ear pain, rhinorrhea, sinus pressure and sore throat.   Eyes:  Negative for pain, redness and visual disturbance.  Respiratory:  Negative for cough, shortness of breath and wheezing.   Cardiovascular:  Negative for chest pain and palpitations.  Gastrointestinal:  Negative for abdominal pain, blood in stool, constipation and diarrhea.  Endocrine: Negative for polydipsia and polyuria.  Genitourinary:   Negative for dysuria, frequency and urgency.  Musculoskeletal:  Negative for arthralgias, back pain and myalgias.  Skin:  Negative for pallor and rash.  Allergic/Immunologic: Negative for environmental allergies.  Neurological:  Negative for dizziness, syncope and headaches.  Hematological:  Negative  for adenopathy. Does not bruise/bleed easily.  Psychiatric/Behavioral:  Negative for decreased concentration and dysphoric mood. The patient is not nervous/anxious.        Objective:   Physical Exam Constitutional:      General: She is not in acute distress.    Appearance: Normal appearance. She is well-developed. She is not ill-appearing or diaphoretic.  HENT:     Head: Normocephalic and atraumatic.  Eyes:     General: No scleral icterus.    Conjunctiva/sclera: Conjunctivae normal.     Pupils: Pupils are equal, round, and reactive to light.  Neck:     Thyroid : No thyromegaly.     Vascular: No carotid bruit or JVD.  Cardiovascular:     Rate and Rhythm: Normal rate and regular rhythm.     Heart sounds: Normal heart sounds.     No gallop.  Pulmonary:     Effort: Pulmonary effort is normal. No respiratory distress.     Breath sounds: Normal breath sounds. No stridor. No wheezing, rhonchi or rales.     Comments: Good air exch  Abdominal:     General: There is no distension or abdominal bruit.     Palpations: Abdomen is soft. There is no mass.     Tenderness: There is no abdominal tenderness.     Comments: No HSM No tenderness  Musculoskeletal:     Cervical back: Normal range of motion and neck supple.     Right lower leg: No edema.     Left lower leg: No edema.  Lymphadenopathy:     Cervical: No cervical adenopathy.  Skin:    General: Skin is warm and dry.     Coloration: Skin is not pale.     Findings: No rash.  Neurological:     Mental Status: She is alert.     Coordination: Coordination normal.     Deep Tendon Reflexes: Reflexes are normal and symmetric. Reflexes  normal.  Psychiatric:        Mood and Affect: Mood normal.           Assessment & Plan:   Problem List Items Addressed This Visit       Endocrine   Hypothyroidism   Hypothyroidism  Pt has no clinical changes No change in energy level/ hair or skin/ edema and no tremor Lab Results  Component Value Date   TSH 5.28 09/14/2023    Continues levothyroxine  100 mcg daily         Genitourinary   TURNER'S SYNDROME   May be reason for elevated alk phos and transaminase        Other   Productive cough   Resolved Was treated for bronchitis Normal exam    Pna vaccine given today      Hyponatremia   Last visit may have been due to illness Drinks fair amt of fluid  Re check today      Relevant Orders   Basic metabolic panel with GFR   Elevated WBC count   Lab Results  Component Value Date   WBC 16.6 (H) 09/14/2023   HGB 13.1 09/14/2023   HCT 38.9 09/14/2023   MCV 94.8 09/14/2023   PLT 195.0 09/14/2023   May be due to steroids with bronchitis at time of labs Re check today  Normal exam No symptoms       Relevant Orders   CBC with Differential/Platelet   Elevated liver enzymes - Primary   Lab Results  Component Value Date  ALT 39 (H) 09/14/2023   AST 27 09/14/2023   ALKPHOS 203 (H) 09/14/2023   BILITOT 0.9 09/14/2023   May be due to Turner's syndrome  Re check today No GI symptoms  Reassuring exam      Relevant Orders   Hepatic function panel   Elevated glucose level   106 fasting A1c ordered today disc imp of low glycemic diet and wt loss to prevent DM2       Relevant Orders   Hemoglobin A1c

## 2023-11-25 NOTE — Assessment & Plan Note (Signed)
 Lab Results  Component Value Date   WBC 16.6 (H) 09/14/2023   HGB 13.1 09/14/2023   HCT 38.9 09/14/2023   MCV 94.8 09/14/2023   PLT 195.0 09/14/2023   May be due to steroids with bronchitis at time of labs Re check today  Normal exam No symptoms

## 2023-11-25 NOTE — Assessment & Plan Note (Signed)
 Resolved Was treated for bronchitis Normal exam    Pna vaccine given today

## 2023-11-25 NOTE — Assessment & Plan Note (Signed)
 Hypothyroidism  Pt has no clinical changes No change in energy level/ hair or skin/ edema and no tremor Lab Results  Component Value Date   TSH 5.28 09/14/2023    Continues levothyroxine  100 mcg daily

## 2023-11-25 NOTE — Patient Instructions (Signed)
 Pneumonia shot today    Labs for glucose/sodium and liver tests   Glad you are feeling better

## 2023-11-25 NOTE — Assessment & Plan Note (Signed)
 Last visit may have been due to illness Drinks fair amt of fluid  Re check today

## 2023-11-25 NOTE — Assessment & Plan Note (Signed)
 Lab Results  Component Value Date   ALT 39 (H) 09/14/2023   AST 27 09/14/2023   ALKPHOS 203 (H) 09/14/2023   BILITOT 0.9 09/14/2023   May be due to Turner's syndrome  Re check today No GI symptoms  Reassuring exam

## 2024-01-12 ENCOUNTER — Telehealth: Payer: Self-pay | Admitting: Family Medicine

## 2024-01-12 NOTE — Telephone Encounter (Signed)
What would this be for?  

## 2024-01-12 NOTE — Telephone Encounter (Signed)
 Patient is going on a bus trip to [place] , Leaving on Saturday is asking if she can get a script for IVERMECTIN so they have it for the trip

## 2024-01-12 NOTE — Telephone Encounter (Signed)
 Pt said she is going to be around a lot of people while traveling and wanted ivermectin incase she gets covid. I advised pt providers here usually don't prescribed ivermectin for covid they typically will prescribe paxlovid if appropriate however I advise pt we do not prescribe meds unless pt is sick and has tested positive for covid. So if pt does develop any sxs and test positive for covid then let us  know. Handwashing and masking advised for pt.

## 2024-01-12 NOTE — Telephone Encounter (Signed)
 Would like a scrip of

## 2024-01-12 NOTE — Telephone Encounter (Signed)
 That is not an approved treatment for covid   there is noting I know of to prevent getting covid besides wearing a mask in public places  Take care of yourself as well

## 2024-03-16 ENCOUNTER — Other Ambulatory Visit: Payer: Self-pay | Admitting: Family Medicine

## 2024-03-28 ENCOUNTER — Telehealth: Payer: Self-pay | Admitting: *Deleted

## 2024-03-28 DIAGNOSIS — Z9621 Cochlear implant status: Secondary | ICD-10-CM | POA: Insufficient documentation

## 2024-03-28 DIAGNOSIS — H9193 Unspecified hearing loss, bilateral: Secondary | ICD-10-CM

## 2024-03-28 NOTE — Telephone Encounter (Signed)
 Left VM letting pt know Dr. Randeen placed referral and if she doesn't get any communication within 2 weeks to let us  know

## 2024-03-28 NOTE — Telephone Encounter (Signed)
 I put the referral in for ENT Please let us  know if you don't hear in 1-2 weeks to set that up (mychart message or call or letter)

## 2024-03-28 NOTE — Telephone Encounter (Signed)
 Copied from CRM #[id number]. Topic: Referral - Request for Referral >> Mar 28, 2024 10:39 AM Carlyon D wrote: Did the patient discuss referral with their provider in the last year? Yes (If No - schedule appointment) (If Yes - send message)  Appointment offered? No  Type of order/referral and detailed reason for visit: ENT  Preference of office, provider, location:Wants some one out of [place].  As her current ENT dr is retiring   If referral order, have you been seen by this specialty before? Yes (If Yes, this issue or another issue? When? Where?  Can we respond through MyChart? Yes

## 2024-04-15 ENCOUNTER — Telehealth: Payer: Self-pay | Admitting: Family Medicine

## 2024-04-15 NOTE — Telephone Encounter (Signed)
 Copied from CRM #[id number]. Topic: Referral - Status >> Apr 15, 2024  3:19 PM Ashley R wrote: Reason for CRM: checking on referral [id number] to [facility]

## 2024-04-15 NOTE — Telephone Encounter (Signed)
 Routing to referral dpt

## 2024-04-18 ENCOUNTER — Encounter: Payer: Self-pay | Admitting: *Deleted

## 2024-04-18 NOTE — Telephone Encounter (Signed)
 See referral notes.   Referral faxed to   [facility] at [facility] [address] [employer] [place], [place] [address] Phone: [phone] Fax: [phone]   Pt made aware via Mychart of referral information

## 2024-05-02 ENCOUNTER — Encounter: Payer: Self-pay | Admitting: Family Medicine

## 2024-05-30 ENCOUNTER — Ambulatory Visit: Payer: Self-pay

## 2024-05-30 NOTE — Telephone Encounter (Signed)
 Called pt and she didn't go to the ER, her vertigo sxs have resolved. She did look it up and getting her cochlear implant replaced after not wearing one for a while can cause vertigo briefly while your body is getting back use to it so she thinks that's what it was but again stared her sxs have resolved completley

## 2024-05-30 NOTE — Telephone Encounter (Signed)
 I do not see ED notes in epic- but she may have gone outside the system   Please keep me updated

## 2024-05-30 NOTE — Telephone Encounter (Signed)
 Aware, will watch for correspondence

## 2024-05-30 NOTE — Telephone Encounter (Signed)
 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     Caller disconnected, does not want to wait on hold call back number is [phone]   ----- Message from Mercedes MATSU sent at 05/30/2024  8:48 AM EST -----  Reason for Triage: Patient called in c/o symptoms of Vertigo, nausea, and headaches started early Sunday morning and they are not getting any better. Patient highly concerned and wants to know what she should do.  Reason for Disposition  [1] Dizziness (vertigo) present now AND [2] age > 50  (Exception: Prior doctor or NP/PA evaluation for this AND no different/worse than usual.)  Answer Assessment - Initial Assessment Questions Cochlear implant part replaced mid of last week, she states the magnet part that goes to the side of your head. Midnight on Sunday she woke up with dizziness and nausea. She said she could not sit up, when she did, she fell back to the pillow. She states her husband was having to help her get around. She states she is still using furniture to get around at times. She feels slightly better today. She does also endorse a headache- not severe but pt has taken ibuprofen. RN recommended ER due to still using furniture at times, headache, and age. Pt stated understanding. She does have someone who can driver her.  Due to Er dispo, not all questions answered.    1. DESCRIPTION: Describe your dizziness.     spinning 2. VERTIGO: Do you feel like either you or the room is spinning or tilting?      yes 3. LIGHTHEADED: Do you feel lightheaded? (e.g., somewhat faint, woozy, weak upon standing)     no 4. SEVERITY: How bad is it?  Can you walk?     Better now, can walk a little bit 5. ONSET:  When did the dizziness begin?     Sunday morning at midnight 6. AGGRAVATING FACTORS: Does anything make it worse? (e.g.,  standing, change in head position)      7. CAUSE: What do you think is causing the dizziness?      8. RECURRENT SYMPTOM: Have you had dizziness before? If Yes, ask: When was the last time? What happened that time?      9. OTHER SYMPTOMS: Do you have any other symptoms? (e.g., earache, headache, numbness, tinnitus, vomiting, weakness)     Headache, nausea 10. PREGNANCY: Is there any chance you are pregnant? When was your last menstrual period?  Protocols used: Dizziness - Vertigo-A-AH

## 2024-05-30 NOTE — Telephone Encounter (Signed)
 Good to hear that

## 2024-05-30 NOTE — Telephone Encounter (Signed)
 FYI Only or Action Required?: FYI only for provider: ED advised.  Patient was last seen in primary care on 11/25/2023 by Randeen Laine LABOR, MD.  Called Nurse Triage reporting Dizziness.  Symptoms began several days ago.  Interventions attempted: Rest, hydration, or home remedies.  Symptoms are: gradually improving.  Triage Disposition: Go to ED Now (or PCP Triage)  Patient/caregiver understands and will follow disposition?: Yes

## 2024-05-30 NOTE — Telephone Encounter (Signed)
Patient was advised to go to ED

## 2024-09-12 ENCOUNTER — Other Ambulatory Visit

## 2024-09-19 ENCOUNTER — Encounter: Admitting: Family Medicine
